# Patient Record
Sex: Female | Born: 1945 | Race: White | Hispanic: No | Smoking: Never smoker
Health system: Southern US, Community
[De-identification: ages and names within clinical notes are randomized; demographics above are authoritative.]

## PROBLEM LIST (undated history)

## (undated) DIAGNOSIS — M1990 Unspecified osteoarthritis, unspecified site: Secondary | ICD-10-CM

## (undated) DIAGNOSIS — I8311 Varicose veins of right lower extremity with inflammation: Secondary | ICD-10-CM

## (undated) DIAGNOSIS — F329 Major depressive disorder, single episode, unspecified: Secondary | ICD-10-CM

## (undated) DIAGNOSIS — K279 Peptic ulcer, site unspecified, unspecified as acute or chronic, without hemorrhage or perforation: Secondary | ICD-10-CM

## (undated) DIAGNOSIS — E669 Obesity, unspecified: Secondary | ICD-10-CM

## (undated) DIAGNOSIS — D649 Anemia, unspecified: Secondary | ICD-10-CM

## (undated) DIAGNOSIS — F32A Depression, unspecified: Secondary | ICD-10-CM

## (undated) DIAGNOSIS — C801 Malignant (primary) neoplasm, unspecified: Secondary | ICD-10-CM

## (undated) DIAGNOSIS — G4730 Sleep apnea, unspecified: Secondary | ICD-10-CM

## (undated) DIAGNOSIS — Z9109 Other allergy status, other than to drugs and biological substances: Secondary | ICD-10-CM

## (undated) DIAGNOSIS — R0600 Dyspnea, unspecified: Secondary | ICD-10-CM

## (undated) DIAGNOSIS — E785 Hyperlipidemia, unspecified: Secondary | ICD-10-CM

## (undated) DIAGNOSIS — C50919 Malignant neoplasm of unspecified site of unspecified female breast: Secondary | ICD-10-CM

## (undated) DIAGNOSIS — Z86711 Personal history of pulmonary embolism: Secondary | ICD-10-CM

## (undated) DIAGNOSIS — Z8719 Personal history of other diseases of the digestive system: Secondary | ICD-10-CM

## (undated) DIAGNOSIS — I10 Essential (primary) hypertension: Secondary | ICD-10-CM

## (undated) DIAGNOSIS — I8312 Varicose veins of left lower extremity with inflammation: Secondary | ICD-10-CM

## (undated) HISTORY — PX: VASCULAR SURGERY: SHX849

## (undated) HISTORY — PX: HERNIA REPAIR: SHX51

## (undated) HISTORY — PX: BREAST LUMPECTOMY: SHX2

## (undated) HISTORY — PX: IR SCLEROTHERAPY OF A FLUID COLLECTION: IMG6090

## (undated) HISTORY — PX: ABDOMINAL HYSTERECTOMY: SHX81

---

## 2005-01-06 ENCOUNTER — Ambulatory Visit: Payer: Self-pay | Admitting: Internal Medicine

## 2005-08-22 ENCOUNTER — Ambulatory Visit: Payer: Self-pay | Admitting: Unknown Physician Specialty

## 2005-09-19 ENCOUNTER — Ambulatory Visit: Payer: Self-pay

## 2006-01-19 ENCOUNTER — Ambulatory Visit: Payer: Self-pay | Admitting: Internal Medicine

## 2006-01-25 ENCOUNTER — Ambulatory Visit: Payer: Self-pay | Admitting: Unknown Physician Specialty

## 2006-08-21 ENCOUNTER — Ambulatory Visit: Payer: Self-pay | Admitting: Internal Medicine

## 2007-01-01 ENCOUNTER — Ambulatory Visit: Payer: Self-pay | Admitting: Internal Medicine

## 2007-01-25 ENCOUNTER — Ambulatory Visit: Payer: Self-pay | Admitting: Internal Medicine

## 2007-07-14 ENCOUNTER — Ambulatory Visit: Payer: Self-pay | Admitting: Internal Medicine

## 2007-07-27 ENCOUNTER — Ambulatory Visit: Payer: Self-pay | Admitting: Internal Medicine

## 2007-07-30 ENCOUNTER — Ambulatory Visit: Payer: Self-pay | Admitting: Internal Medicine

## 2007-08-13 ENCOUNTER — Ambulatory Visit: Payer: Self-pay | Admitting: Internal Medicine

## 2007-09-13 ENCOUNTER — Ambulatory Visit: Payer: Self-pay | Admitting: Internal Medicine

## 2007-10-14 ENCOUNTER — Ambulatory Visit: Payer: Self-pay | Admitting: Internal Medicine

## 2007-10-24 ENCOUNTER — Ambulatory Visit: Payer: Self-pay | Admitting: Unknown Physician Specialty

## 2007-10-29 ENCOUNTER — Ambulatory Visit: Payer: Self-pay | Admitting: Unknown Physician Specialty

## 2007-11-11 ENCOUNTER — Ambulatory Visit: Payer: Self-pay | Admitting: Internal Medicine

## 2007-12-14 ENCOUNTER — Ambulatory Visit: Payer: Self-pay | Admitting: Internal Medicine

## 2008-01-11 ENCOUNTER — Ambulatory Visit: Payer: Self-pay | Admitting: Internal Medicine

## 2008-02-12 ENCOUNTER — Ambulatory Visit: Payer: Self-pay | Admitting: Internal Medicine

## 2008-03-12 ENCOUNTER — Ambulatory Visit: Payer: Self-pay | Admitting: Internal Medicine

## 2008-04-12 ENCOUNTER — Ambulatory Visit: Payer: Self-pay | Admitting: Internal Medicine

## 2008-04-23 ENCOUNTER — Ambulatory Visit: Payer: Self-pay | Admitting: Internal Medicine

## 2008-04-24 ENCOUNTER — Ambulatory Visit: Payer: Self-pay | Admitting: Unknown Physician Specialty

## 2008-05-13 ENCOUNTER — Ambulatory Visit: Payer: Self-pay | Admitting: Internal Medicine

## 2008-06-10 ENCOUNTER — Ambulatory Visit: Payer: Self-pay | Admitting: Unknown Physician Specialty

## 2008-06-30 ENCOUNTER — Ambulatory Visit: Payer: Self-pay | Admitting: Unknown Physician Specialty

## 2008-07-07 ENCOUNTER — Ambulatory Visit: Payer: Self-pay | Admitting: Unknown Physician Specialty

## 2008-12-11 HISTORY — PX: JOINT REPLACEMENT: SHX530

## 2008-12-22 ENCOUNTER — Ambulatory Visit: Payer: Self-pay | Admitting: Unknown Physician Specialty

## 2009-01-01 ENCOUNTER — Inpatient Hospital Stay: Payer: Self-pay | Admitting: Unknown Physician Specialty

## 2009-01-19 ENCOUNTER — Ambulatory Visit: Payer: Self-pay

## 2009-02-17 ENCOUNTER — Ambulatory Visit: Payer: Self-pay | Admitting: Internal Medicine

## 2009-08-05 ENCOUNTER — Ambulatory Visit: Payer: Self-pay | Admitting: Internal Medicine

## 2009-08-12 ENCOUNTER — Ambulatory Visit: Payer: Self-pay | Admitting: Internal Medicine

## 2010-03-25 ENCOUNTER — Ambulatory Visit: Payer: Self-pay | Admitting: Internal Medicine

## 2011-05-10 ENCOUNTER — Ambulatory Visit: Payer: Self-pay | Admitting: Internal Medicine

## 2011-05-25 ENCOUNTER — Ambulatory Visit: Payer: Self-pay | Admitting: Internal Medicine

## 2012-05-11 ENCOUNTER — Ambulatory Visit: Payer: Self-pay | Admitting: Internal Medicine

## 2012-05-25 ENCOUNTER — Ambulatory Visit: Payer: Self-pay | Admitting: Internal Medicine

## 2012-09-12 DIAGNOSIS — Z923 Personal history of irradiation: Secondary | ICD-10-CM

## 2012-09-12 DIAGNOSIS — C801 Malignant (primary) neoplasm, unspecified: Secondary | ICD-10-CM

## 2012-09-12 DIAGNOSIS — C50919 Malignant neoplasm of unspecified site of unspecified female breast: Secondary | ICD-10-CM

## 2012-09-12 HISTORY — DX: Malignant neoplasm of unspecified site of unspecified female breast: C50.919

## 2012-09-12 HISTORY — DX: Personal history of irradiation: Z92.3

## 2012-09-12 HISTORY — DX: Malignant (primary) neoplasm, unspecified: C80.1

## 2013-05-27 ENCOUNTER — Ambulatory Visit: Payer: Self-pay | Admitting: Internal Medicine

## 2013-05-30 ENCOUNTER — Ambulatory Visit: Payer: Self-pay | Admitting: Unknown Physician Specialty

## 2013-06-04 ENCOUNTER — Ambulatory Visit: Payer: Self-pay | Admitting: Internal Medicine

## 2013-06-06 ENCOUNTER — Ambulatory Visit: Payer: Self-pay | Admitting: Internal Medicine

## 2013-06-06 HISTORY — PX: BREAST BIOPSY: SHX20

## 2013-06-12 LAB — PATHOLOGY REPORT

## 2013-06-13 ENCOUNTER — Encounter: Payer: Self-pay | Admitting: *Deleted

## 2013-06-20 ENCOUNTER — Ambulatory Visit: Payer: Self-pay | Admitting: Surgery

## 2013-06-25 ENCOUNTER — Ambulatory Visit: Payer: Self-pay | Admitting: Surgery

## 2013-07-01 ENCOUNTER — Ambulatory Visit: Payer: Self-pay | Admitting: General Surgery

## 2013-07-02 ENCOUNTER — Ambulatory Visit: Payer: Self-pay | Admitting: Oncology

## 2013-07-02 LAB — PATHOLOGY REPORT

## 2013-07-13 ENCOUNTER — Ambulatory Visit: Payer: Self-pay | Admitting: Oncology

## 2013-08-01 LAB — CBC CANCER CENTER
Basophil #: 0.1 x10 3/mm (ref 0.0–0.1)
Eosinophil #: 0.3 x10 3/mm (ref 0.0–0.7)
Eosinophil %: 4.9 %
HCT: 43.3 % (ref 35.0–47.0)
HGB: 14 g/dL (ref 12.0–16.0)
Lymphocyte #: 1.1 x10 3/mm (ref 1.0–3.6)
Lymphocyte %: 16.4 %
MCH: 29.8 pg (ref 26.0–34.0)
MCHC: 32.5 g/dL (ref 32.0–36.0)
MCV: 92 fL (ref 80–100)
Monocyte #: 0.7 x10 3/mm (ref 0.2–0.9)
Monocyte %: 9.5 %
Neutrophil %: 68.4 %
Platelet: 393 x10 3/mm (ref 150–440)
RBC: 4.71 10*6/uL (ref 3.80–5.20)
WBC: 7 x10 3/mm (ref 3.6–11.0)

## 2013-08-12 ENCOUNTER — Ambulatory Visit: Payer: Self-pay | Admitting: Oncology

## 2013-08-15 LAB — CBC CANCER CENTER
Basophil %: 1.1 %
Eosinophil %: 7.9 %
HCT: 39.1 % (ref 35.0–47.0)
HGB: 13.1 g/dL (ref 12.0–16.0)
Lymphocyte #: 0.7 x10 3/mm — ABNORMAL LOW (ref 1.0–3.6)
MCH: 30.6 pg (ref 26.0–34.0)
MCHC: 33.6 g/dL (ref 32.0–36.0)
Monocyte #: 0.6 x10 3/mm (ref 0.2–0.9)
Monocyte %: 12.8 %
Neutrophil #: 2.9 x10 3/mm (ref 1.4–6.5)
Platelet: 310 x10 3/mm (ref 150–440)
RBC: 4.29 10*6/uL (ref 3.80–5.20)
RDW: 13.5 % (ref 11.5–14.5)
WBC: 4.6 x10 3/mm (ref 3.6–11.0)

## 2013-08-22 LAB — CBC CANCER CENTER
Basophil #: 0 x10 3/mm (ref 0.0–0.1)
Basophil %: 0.4 %
HCT: 42.7 % (ref 35.0–47.0)
HGB: 14.1 g/dL (ref 12.0–16.0)
MCH: 30.3 pg (ref 26.0–34.0)
Monocyte %: 5.2 %
Neutrophil %: 87.1 %
Platelet: 404 x10 3/mm (ref 150–440)
RBC: 4.65 10*6/uL (ref 3.80–5.20)
WBC: 12 x10 3/mm — ABNORMAL HIGH (ref 3.6–11.0)

## 2013-08-29 ENCOUNTER — Ambulatory Visit: Payer: Self-pay | Admitting: Cardiology

## 2013-08-29 LAB — CBC CANCER CENTER
Basophil %: 1.1 %
Eosinophil #: 0.3 x10 3/mm (ref 0.0–0.7)
Eosinophil %: 3.5 %
HCT: 41.1 % (ref 35.0–47.0)
HGB: 13.4 g/dL (ref 12.0–16.0)
Lymphocyte %: 11.1 %
MCH: 29.9 pg (ref 26.0–34.0)
Monocyte #: 0.7 x10 3/mm (ref 0.2–0.9)
Neutrophil %: 74.7 %
RBC: 4.49 10*6/uL (ref 3.80–5.20)
RDW: 13.8 % (ref 11.5–14.5)

## 2013-09-12 ENCOUNTER — Ambulatory Visit: Payer: Self-pay | Admitting: Oncology

## 2013-09-25 ENCOUNTER — Ambulatory Visit: Payer: Self-pay | Admitting: Cardiology

## 2013-10-13 ENCOUNTER — Ambulatory Visit: Payer: Self-pay | Admitting: Oncology

## 2013-11-15 DIAGNOSIS — M23329 Other meniscus derangements, posterior horn of medial meniscus, unspecified knee: Secondary | ICD-10-CM | POA: Insufficient documentation

## 2013-11-15 DIAGNOSIS — I10 Essential (primary) hypertension: Secondary | ICD-10-CM | POA: Insufficient documentation

## 2013-11-15 DIAGNOSIS — Z9660 Presence of unspecified orthopedic joint implant: Secondary | ICD-10-CM | POA: Insufficient documentation

## 2014-01-06 ENCOUNTER — Ambulatory Visit: Payer: Self-pay | Admitting: Oncology

## 2014-01-06 LAB — CBC CANCER CENTER
BASOS PCT: 1.1 %
Basophil #: 0.1 x10 3/mm (ref 0.0–0.1)
EOS ABS: 0.3 x10 3/mm (ref 0.0–0.7)
EOS PCT: 3.9 %
HCT: 39.5 % (ref 35.0–47.0)
HGB: 13.2 g/dL (ref 12.0–16.0)
Lymphocyte #: 0.9 x10 3/mm — ABNORMAL LOW (ref 1.0–3.6)
Lymphocyte %: 13.4 %
MCH: 30.3 pg (ref 26.0–34.0)
MCHC: 33.5 g/dL (ref 32.0–36.0)
MCV: 90 fL (ref 80–100)
Monocyte #: 0.6 x10 3/mm (ref 0.2–0.9)
Monocyte %: 9.1 %
Neutrophil #: 4.9 x10 3/mm (ref 1.4–6.5)
Neutrophil %: 72.5 %
Platelet: 351 x10 3/mm (ref 150–440)
RBC: 4.37 10*6/uL (ref 3.80–5.20)
RDW: 13.3 % (ref 11.5–14.5)
WBC: 6.7 x10 3/mm (ref 3.6–11.0)

## 2014-01-06 LAB — COMPREHENSIVE METABOLIC PANEL
Albumin: 3.5 g/dL (ref 3.4–5.0)
Alkaline Phosphatase: 113 U/L
Anion Gap: 10 (ref 7–16)
BUN: 14 mg/dL (ref 7–18)
Bilirubin,Total: 0.2 mg/dL (ref 0.2–1.0)
Calcium, Total: 9.4 mg/dL (ref 8.5–10.1)
Chloride: 106 mmol/L (ref 98–107)
Co2: 27 mmol/L (ref 21–32)
Creatinine: 1.09 mg/dL (ref 0.60–1.30)
EGFR (African American): >60
EGFR (Non-African Amer.): 53 — ABNORMAL LOW
Glucose: 108 mg/dL — ABNORMAL HIGH (ref 65–99)
Osmolality: 286 (ref 275–301)
POTASSIUM: 4 mmol/L (ref 3.5–5.1)
SGOT(AST): 21 U/L (ref 15–37)
SGPT (ALT): 34 U/L (ref 12–78)
Sodium: 143 mmol/L (ref 136–145)
TOTAL PROTEIN: 7.1 g/dL (ref 6.4–8.2)

## 2014-01-10 ENCOUNTER — Ambulatory Visit: Payer: Self-pay | Admitting: Oncology

## 2014-02-07 DIAGNOSIS — C50412 Malignant neoplasm of upper-outer quadrant of left female breast: Secondary | ICD-10-CM | POA: Insufficient documentation

## 2014-02-07 DIAGNOSIS — R739 Hyperglycemia, unspecified: Secondary | ICD-10-CM | POA: Insufficient documentation

## 2014-02-07 DIAGNOSIS — G4733 Obstructive sleep apnea (adult) (pediatric): Secondary | ICD-10-CM | POA: Insufficient documentation

## 2014-04-22 ENCOUNTER — Ambulatory Visit: Payer: Self-pay | Admitting: Oncology

## 2014-04-22 LAB — COMPREHENSIVE METABOLIC PANEL
Albumin: 3.6 g/dL (ref 3.4–5.0)
Alkaline Phosphatase: 121 U/L — ABNORMAL HIGH
Anion Gap: 11 (ref 7–16)
BUN: 22 mg/dL — AB (ref 7–18)
Bilirubin,Total: 0.3 mg/dL (ref 0.2–1.0)
CALCIUM: 9.3 mg/dL (ref 8.5–10.1)
CHLORIDE: 102 mmol/L (ref 98–107)
CO2: 24 mmol/L (ref 21–32)
CREATININE: 1.25 mg/dL (ref 0.60–1.30)
EGFR (African American): 52 — ABNORMAL LOW
EGFR (Non-African Amer.): 44 — ABNORMAL LOW
Glucose: 105 mg/dL — ABNORMAL HIGH (ref 65–99)
Osmolality: 278 (ref 275–301)
Potassium: 4.6 mmol/L (ref 3.5–5.1)
SGOT(AST): 23 U/L (ref 15–37)
SGPT (ALT): 36 U/L
SODIUM: 137 mmol/L (ref 136–145)
Total Protein: 7.4 g/dL (ref 6.4–8.2)

## 2014-04-22 LAB — CBC CANCER CENTER
BASOS ABS: 0.1 x10 3/mm (ref 0.0–0.1)
Basophil %: 1 %
Eosinophil #: 0.3 x10 3/mm (ref 0.0–0.7)
Eosinophil %: 4.1 %
HCT: 41.7 % (ref 35.0–47.0)
HGB: 13.8 g/dL (ref 12.0–16.0)
LYMPHS ABS: 0.9 x10 3/mm — AB (ref 1.0–3.6)
LYMPHS PCT: 13.2 %
MCH: 30 pg (ref 26.0–34.0)
MCHC: 32.9 g/dL (ref 32.0–36.0)
MCV: 91 fL (ref 80–100)
MONOS PCT: 8.5 %
Monocyte #: 0.6 x10 3/mm (ref 0.2–0.9)
NEUTROS ABS: 4.9 x10 3/mm (ref 1.4–6.5)
NEUTROS PCT: 73.2 %
Platelet: 342 x10 3/mm (ref 150–440)
RBC: 4.58 10*6/uL (ref 3.80–5.20)
RDW: 13.4 % (ref 11.5–14.5)
WBC: 6.6 x10 3/mm (ref 3.6–11.0)

## 2014-05-13 ENCOUNTER — Ambulatory Visit: Payer: Self-pay | Admitting: Oncology

## 2014-06-12 ENCOUNTER — Ambulatory Visit: Payer: Self-pay | Admitting: Oncology

## 2014-10-28 ENCOUNTER — Ambulatory Visit: Payer: Self-pay | Admitting: Oncology

## 2014-11-11 ENCOUNTER — Ambulatory Visit
Admit: 2014-11-11 | Disposition: A | Discharge status: Home / Self Care | Payer: Self-pay | Attending: Oncology | Admitting: Oncology

## 2014-12-30 ENCOUNTER — Ambulatory Visit
Admit: 2014-12-30 | Disposition: A | Discharge status: Home / Self Care | Payer: Self-pay | Attending: Ophthalmology | Admitting: Ophthalmology

## 2015-01-02 NOTE — Op Note (Signed)
PATIENT NAME:  Beth Hoover, SPIEWAK MR#:  [id number] DATE OF BIRTH:  02-04-1946  DATE OF PROCEDURE:  06/25/2013  PREOPERATIVE DIAGNOSIS: Carcinoma of the left breast.   POSTOPERATIVE DIAGNOSIS: Carcinoma of the left breast.   PROCEDURE: Left partial mastectomy with axillary sentinel lymph node biopsy.   SURGEON: Rochel Brome, M.D.   ANESTHESIA: General.   INDICATIONS: This 69 year old female recently had a mammogram depicting a density in the upper outer quadrant of the left breast at approximately 12:30 position, 5 cm from the nipple. She had ultrasound-guided core biopsy demonstrating infiltrating ductal carcinoma. She also had a palpable nodule in the inferior aspect of the left axilla, which was consistent with a lymph node. Surgery was recommended for definitive treatment. She did have preoperative injection of radioactive technetium sulfur colloid. She had x-ray needle localization. The mammogram images were reviewed seeing the Kopans wire and the biopsy marker in the upper outer aspect of the left breast.   DESCRIPTION OF PROCEDURE: The patient was placed on the operating table in the supine position under general anesthesia. The dressing was removed from the left breast exposing the Kopans wire, which entered the breast at approximately the 2:00 position. Mammogram images were reviewed. The left arm was placed on a lateral arm support. The left breast, axilla, and surrounding chest wall and upper arm were prepared with ChloraPrep and draped in a sterile manner.   The axilla was probed with a gamma counter demonstrating location of radioactivity in the inferior aspect of the axilla. There was no grossly palpable mass at this time. An oblique incision was made in the inferior aspect of the axilla, approximately 5 cm in length and carried down through subcutaneous tissues. Several small bleeding points were cauterized. The superficial fascia was incised. Dissection was carried down deeply within the  axilla adjacent to the rib cage. There was a finding of radioactivity using the gamma counter and dissected down to encounter a lymph node, which was approximately 12 mm in dimension and was consistent with what was palpable during the preop exam. This was rounded and smooth and slightly firm. It was dissected free from surrounding structures. The ex vivo count was greater than 3500 counts per second. The background count was less than 40 counts per second. There was no remaining palpable mass within the axilla. The sentinel lymph node was sent for routine pathology. The wound was further inspected. It is noted that during the course of the procedure, a number of small bleeding points were cauterized. Hemostasis subsequently appeared to be intact.   Attention was then turned to do the left partial mastectomy. A curvilinear incision was made from approximately the 1:00 to 3:00 position and this was elliptical and curvilinear and excised a width of skin of approximately 8 mm surrounding the wire. It was dissected down on both sides of the wire and enlarged as the dissection as it progressed deeper. There was some firmness around the biopsy site and a finger palpation helped in the scope was dissection and a portion of tissue, which was approximately 5 x 5 x 5 cm was resected. It was labeled with the margin map to mark the cranial, caudal, medial, lateral, and deep margins. Also, the 3:00 end of the skin ellipse was tagged with a nylon stitch for the pathologist's orientation and the specimen was submitted fresh for specimen mammogram and pathology with gross inspection for margins.   The axillary wound was further inspected and hemostasis appeared to be intact. Subcutaneous tissues were  infiltrated with 0.5% Sensorcaine with epinephrine. The wound was closed with running 4-0 Monocryl subcuticular suture. Next, the partial mastectomy incision was further examined and hemostasis appeared to be intact. Sensorcaine  0.5% with epinephrine was injected around cautery artifact and also in the subcutaneous tissues. Next, the subcutaneous tissues were closed with interrupted 4-0 chromic simple sutures and the skin was closed with running 4-0 Monocryl subcuticular suture. The pathologist did call back indicating the closest margin was inferior and approximately 8 mm and tissue submitted for routine pathology.   The wounds were treated with Dermabond. The patient was then awakened and carried to the recovery room for postoperative care.   ____________________________ J. Rochel Brome, MD jws:aw D: 06/25/2013 12:42:37 ET T: 06/25/2013 13:04:04 ET JOB#: [id number]  cc: Loreli Dollar, MD, <Dictator> Loreli Dollar MD ELECTRONICALLY SIGNED 06/25/2013 13:55

## 2015-01-02 NOTE — Consult Note (Signed)
Reason for Visit: This 69 year old Female patient presents to the clinic for initial evaluation of  breast cancer .   Referred by Dr. Tamala Julian.  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 Total dimensions 4 mm DCIS present margins negative reexcision no residual tumor for adjuvant whole breast radiation plus aromatase inhibitor. Patient is stage I (T1 A. N0 M0)  Pathology Report pathology reports reviewed   Imaging Report mammograms and ultrasound reviewed   Referral Report clinical notes reviewed   Planned Treatment Regimen adjuvant whole breast radiation therapy plus her aromatase inhibitor   HPI   patient is a 69 year old femalewho presented with a 4 mm irregular mass at the 12:30 position of the left breast 5 cm from the nipple. This was confirmed on ultrasound and recommendation for biopsy was made. She underwent core biopsy which was positive for invasive mammary carcinoma grade 1 DCIS was present. One sentinel lymph node was performed negative for metastatic disease. Tumor was strongly ER/PR positive HER-2/neu not overexpressed no lymphovascular invasion was seen. On reexcision no residual tumor was identified.she has done well postoperatively has been seen by medical oncology and based on the small size of her lesion is not thought to be a candidate for systemic chemotherapy. She is seen today for consideration of whole breast radiation. She is doing well. She specifically denies breast tenderness cough or bone pain.  Past Hx:    Breast Cancer:    exertional dyspnea:    Osteoarthritis:    GERD - Esophageal Reflux:    Hiatal Hernia:    Peptic Ulcer Disease:    pharyngitis-recurrent:    Depression:    Obesity:    Hyperlipidemia:    iron def. anemia:    paraesophageal hernia:    right leg vein  stripping and sclerotherapy of legs:    Hysterectomy/oophorectomy:    hiatal hernia surgery: Mar 2009  Past, Family and Social History:  Past Medical History positive   Cardiovascular hyperlipidemia   Respiratory exertional dyspnea, recurrent pharyngitis   Gastrointestinal GERD; hiatal hernia, peptic ulcer disease   Neurological/Psychiatric depression   Past Surgical History hysterectomy nephrectomy, right leg vein stripping, paraesophageal hernia   Past Medical History Comments osteoarthritis, obesity, iron deficiency anemia   Family History positive   Family History Comments breast cancer a paternal and maternal aunts, also family history of colon cancer   Social History noncontributory   Additional Past Medical and Surgical History seen by herself today   Allergies:   Septra: Rash  Actifed: Unknown  Home Meds:  Home Medications: Medication Instructions Status  Metoprolol Tartrate 50 mg oral tablet 1 tab(s) orally once a day (in the morning) Active  meloxicam 15 mg oral tablet 1 tab(s) orally once a day Active  Lipitor 20 mg oral tablet 1 tab(s) orally once a day (at bedtime)  Active  zolpidem 10 mg oral tablet 0.5 tab(s) orally once a day (at bedtime) Active  venlafaxine extended release 150 mg oral tablet, extended release 1 tab(s) orally once a day Active   Review of Systems:  General negative   Performance Status (ECOG) 0   Skin negative   Breast see HPI   Ophthalmologic negative   ENMT negative   Respiratory and Thorax negative   Cardiovascular negative   Gastrointestinal negative   Genitourinary negative   Musculoskeletal negative   Neurological negative  Psychiatric negative   Hematology/Lymphatics negative   Endocrine negative   Allergic/Immunologic negative   Review of Systems   review of systems obtained from nurse's notes  Nursing Notes:  Nursing Vital Signs and Chemo Nursing Nursing Notes: *CC Vital Signs Flowsheet:    27-Oct-14 09:17  Temp Temperature 97.5  Pulse Pulse 79  Respirations Respirations 20  SBP SBP 145  DBP DBP 85  Pain Scale (0-10)  0  Current Weight (kg) (kg) 108.7  Height (cm) centimeters 165  BSA (m2) 2.1   Physical Exam:  General/Skin/HEENT:  General normal   Skin normal   Eyes normal   ENMT normal   Head and Neck normal   Additional PE well-developed obese female in NAD. Breasts are large. She status post wide local excision and sentinel node biopsy the left breast. Breast is healing well no dominant mass or nodularity is noted in either breast into position examined. Lungs are clear to A&P cardiac examination shows regular rate and rhythm. Abdomen is benign. No axillary or supraclavicular adenopathy is appreciated.   Breasts/Resp/CV/GI/GU:  Respiratory and Thorax normal   Cardiovascular normal   Gastrointestinal normal   Genitourinary normal   MS/Neuro/Psych/Lymph:  Musculoskeletal normal   Neurological normal   Lymphatics normal   Other Results:  Radiology Results: LabUnknown:    15-Sep-14 16:38, Screening Digital Mammogram  PACS Image     23-Sep-14 08:53, Digital Additional Views Lt Breast [facility])  PACS Image   [facility]:  Digital Additional Views Lt Breast (SCR)   REASON FOR EXAM:    av lt mass  COMMENTS:       PROCEDURE: MAM - MAM DGTL ADD VW LT  SCR  - Jun 04 2013  8:53AM     *RADIOLOGY REPORT*    Clinical Data:  Possible mass left breast identified on one-view of  the recent screening mammogram.    DIGITAL DIAGNOSTIC LEFT MAMMOGRAM WITH CAD AND LEFT BREAST  ULTRASOUND:    Comparison:  05/27/2013  Findings:    ACR Breast Density Category b:  There are scattered areas of  fibroglandular density.    Focal spot compression view of the upper left breast, anterior  third, demonstrates a slightly irregular approximately 4 mm nodule.  This nodule is not definitely seen on the  90 degrees lateral  projection performed today.  The nodule  is not visible on the CC  view of the left breast from recent screening mammogram.    Mammographic images were processed with CAD.    On physical exam, no mass is palpated in the upper left breast.  Ultrasound is performed, showing an irregular hypoechoic mass at  12:30 position 5 cm from the nipple measuring 3 x 4 x 4 mm.  No  internal vascular flow is identified.    Ultrasound of the left axilla demonstrates normal left axillary  lymph nodes.  No lymphadenopathy is detected.     IMPRESSION:  4 mm irregular mass 12:30 position left breast.  Malignancy cannot  be excluded.    RECOMMENDATION:  Ultrasound guided core needle biopsy is recommended.  The procedure  of biopsy has been discussed with the patient today. Ultrasound-  guided biopsy has been scheduled for the patient at 1:00 pm on  06/06/2013.    I have discussed the findings and recommendations with the patient.  Results were also provided in writing at the conclusion of the  visit.  If applicable, a reminder letter will be sent to the  patient regarding the  next appointment.    BI-RADS CATEGORY 4:  Suspicious abnormality - biopsy should be  considered.      Original Report Authenticated By: Curlene Dolphin, M.D.         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 Based on her large breasts cannot use the hyperfractionated regimens. Would also boost or scar another 1600 cGy using electron beam. Risks and benefits of treatment including skin reaction, fatigue, alteration of blood counts, and possible occlusion of some superficial lung all were explained in detail to the patient. I have set her up for CT situation early next week. Patient will also be a candidate  for aromatase inhibitor after completion of radiation.  I would like to take this opportunity to thank you for allowing me to continue to participate in this patient's care.  CC Referral:  cc: Dr. Tamala Julian, Dr. Fulton Reek   Electronic Signatures: Baruch Gouty, Roda Shutters (MD)  (Signed 27-Oct-14 11:32)  Authored: HPI, Diagnosis, Past Hx, PFSH, Allergies, Home Meds, ROS, Nursing Notes, Physical Exam, Other Results, Encounter Assessment and Plan, CC Referring Physician   Last Updated: 27-Oct-14 11:32 by Armstead Peaks (MD)

## 2015-02-16 ENCOUNTER — Other Ambulatory Visit: Payer: Self-pay | Admitting: Internal Medicine

## 2015-02-16 DIAGNOSIS — R61 Generalized hyperhidrosis: Secondary | ICD-10-CM

## 2015-02-16 DIAGNOSIS — R0789 Other chest pain: Secondary | ICD-10-CM

## 2015-02-16 DIAGNOSIS — R9389 Abnormal findings on diagnostic imaging of other specified body structures: Secondary | ICD-10-CM

## 2015-02-17 ENCOUNTER — Ambulatory Visit
Admission: RE | Admit: 2015-02-17 | Discharge: 2015-02-17 | Disposition: A | Discharge status: Home / Self Care | Payer: Medicare Other | Source: Ambulatory Visit | Attending: Internal Medicine | Admitting: Internal Medicine

## 2015-02-17 DIAGNOSIS — R9389 Abnormal findings on diagnostic imaging of other specified body structures: Secondary | ICD-10-CM

## 2015-02-17 DIAGNOSIS — R61 Generalized hyperhidrosis: Secondary | ICD-10-CM | POA: Insufficient documentation

## 2015-02-17 DIAGNOSIS — R0789 Other chest pain: Secondary | ICD-10-CM | POA: Insufficient documentation

## 2015-02-17 DIAGNOSIS — R938 Abnormal findings on diagnostic imaging of other specified body structures: Secondary | ICD-10-CM | POA: Diagnosis not present

## 2015-02-17 HISTORY — DX: Essential (primary) hypertension: I10

## 2015-02-17 HISTORY — DX: Malignant (primary) neoplasm, unspecified: C80.1

## 2015-03-26 ENCOUNTER — Other Ambulatory Visit: Payer: Self-pay | Admitting: Family Medicine

## 2015-04-24 ENCOUNTER — Other Ambulatory Visit: Payer: Self-pay | Admitting: *Deleted

## 2015-04-24 DIAGNOSIS — C50919 Malignant neoplasm of unspecified site of unspecified female breast: Secondary | ICD-10-CM

## 2015-04-28 ENCOUNTER — Inpatient Hospital Stay: Payer: Medicare Other | Admitting: Oncology

## 2015-04-28 ENCOUNTER — Inpatient Hospital Stay: Payer: Medicare Other | Attending: Oncology

## 2015-04-28 DIAGNOSIS — Z170 Estrogen receptor positive status [ER+]: Secondary | ICD-10-CM | POA: Insufficient documentation

## 2015-04-28 DIAGNOSIS — C50912 Malignant neoplasm of unspecified site of left female breast: Secondary | ICD-10-CM | POA: Insufficient documentation

## 2015-04-28 DIAGNOSIS — Z79811 Long term (current) use of aromatase inhibitors: Secondary | ICD-10-CM | POA: Insufficient documentation

## 2015-04-28 DIAGNOSIS — Z923 Personal history of irradiation: Secondary | ICD-10-CM | POA: Insufficient documentation

## 2015-05-12 ENCOUNTER — Encounter: Payer: Self-pay | Admitting: Oncology

## 2015-05-12 ENCOUNTER — Inpatient Hospital Stay (HOSPITAL_BASED_OUTPATIENT_CLINIC_OR_DEPARTMENT_OTHER): Payer: Medicare Other | Admitting: Oncology

## 2015-05-12 ENCOUNTER — Inpatient Hospital Stay: Payer: Medicare Other

## 2015-05-12 VITALS — BP 132/90 | HR 88 | Temp 96.8°F | Wt 241.0 lb

## 2015-05-12 DIAGNOSIS — C50912 Malignant neoplasm of unspecified site of left female breast: Secondary | ICD-10-CM

## 2015-05-12 DIAGNOSIS — M8580 Other specified disorders of bone density and structure, unspecified site: Secondary | ICD-10-CM

## 2015-05-12 DIAGNOSIS — Z170 Estrogen receptor positive status [ER+]: Secondary | ICD-10-CM

## 2015-05-12 DIAGNOSIS — Z79899 Other long term (current) drug therapy: Secondary | ICD-10-CM

## 2015-05-12 DIAGNOSIS — M1990 Unspecified osteoarthritis, unspecified site: Secondary | ICD-10-CM

## 2015-05-12 DIAGNOSIS — K219 Gastro-esophageal reflux disease without esophagitis: Secondary | ICD-10-CM

## 2015-05-12 DIAGNOSIS — Z923 Personal history of irradiation: Secondary | ICD-10-CM | POA: Diagnosis not present

## 2015-05-12 DIAGNOSIS — Z79811 Long term (current) use of aromatase inhibitors: Secondary | ICD-10-CM

## 2015-05-12 DIAGNOSIS — C50919 Malignant neoplasm of unspecified site of unspecified female breast: Secondary | ICD-10-CM

## 2015-05-12 DIAGNOSIS — E785 Hyperlipidemia, unspecified: Secondary | ICD-10-CM

## 2015-05-12 LAB — CBC WITH DIFFERENTIAL/PLATELET
BASOS PCT: 1 %
Basophils Absolute: 0.1 10*3/uL (ref 0–0.1)
EOS ABS: 0.2 10*3/uL (ref 0–0.7)
EOS PCT: 3 %
HCT: 42 % (ref 35.0–47.0)
Hemoglobin: 14.2 g/dL (ref 12.0–16.0)
Lymphocytes Relative: 30 %
Lymphs Abs: 2 10*3/uL (ref 1.0–3.6)
MCH: 30 pg (ref 26.0–34.0)
MCHC: 33.8 g/dL (ref 32.0–36.0)
MCV: 88.8 fL (ref 80.0–100.0)
MONO ABS: 0.6 10*3/uL (ref 0.2–0.9)
MONOS PCT: 9 %
Neutro Abs: 3.7 10*3/uL (ref 1.4–6.5)
Neutrophils Relative %: 57 %
Platelets: 396 10*3/uL (ref 150–440)
RBC: 4.73 MIL/uL (ref 3.80–5.20)
RDW: 14 % (ref 11.5–14.5)
WBC: 6.5 10*3/uL (ref 3.6–11.0)

## 2015-05-12 LAB — BASIC METABOLIC PANEL
Anion gap: 5 (ref 5–15)
BUN: 21 mg/dL — AB (ref 6–20)
CALCIUM: 8.9 mg/dL (ref 8.9–10.3)
CO2: 25 mmol/L (ref 22–32)
CREATININE: 1.14 mg/dL — AB (ref 0.44–1.00)
Chloride: 102 mmol/L (ref 101–111)
GFR calc Af Amer: 56 mL/min — ABNORMAL LOW (ref >60)
GFR calc non Af Amer: 48 mL/min — ABNORMAL LOW (ref >60)
Glucose, Bld: 129 mg/dL — ABNORMAL HIGH (ref 65–99)
Potassium: 4.5 mmol/L (ref 3.5–5.1)
SODIUM: 132 mmol/L — AB (ref 135–145)

## 2015-05-12 NOTE — Progress Notes (Signed)
Patient does have living will.  Never smoked.  Patient had a "knot" that came up on her left clavicle.  Saw her PCP who did a CT - turned out to be arthritis.  No other c/o today.

## 2015-05-12 NOTE — Progress Notes (Signed)
Beth Hoover @ [facility] Telephone:([phone]) [phone]  Fax:([phone] [place]: 14-Jan-1946  MR#: [id number]  NKN#:[id number]  Patient Care Team: Idelle Crouch, MD as PCP - General (Internal Medicine)  CHIEF COMPLAINT:  Chief Complaint  Patient presents with  . Follow-up     Chief Complaint/Diagnosis:   1. Invasive Carcinoma of the left breast.  Status post partial left breast mastectomy (June 25, 2013). T1a (4 mm tumor) N0 M0, Stage Ia, Estrogen receptor positive, Progesterone receptor +2, HER-2/neu receptor negative by FISH. 2. Finished radiation therapy. 3. Discontinued letrozole in April 2015 due to side effect. 4. Started   ARIMIDEX April 2015. HPI:     INTERVAL HISTORY: 69 year old lady came today for further follow-up regarding carcinoma of breast.  Stage I disease.  Patient is on  anastrozole .  NO BONY PAINS.   HERE  FOR FURTHER FOLLOW-UP AND TREATMENT CONSIDERATION  REVIEW OF SYSTEMS:   GENERAL:  Feels good.  Active.  No fevers, sweats or weight loss. PERFORMANCE STATUS (ECOG):  0 HEENT:  No visual changes, runny nose, sore throat, mouth sores or tenderness. Lungs: No shortness of breath or cough.  No hemoptysis. Cardiac:  No chest pain, palpitations, orthopnea, or PND. GI:  No nausea, vomiting, diarrhea, constipation, melena or hematochezia. GU:  No urgency, frequency, dysuria, or hematuria. Musculoskeletal:  No back pain.  No joint pain.  No muscle tenderness. Extremities:  No pain or swelling. Skin:  No rashes or skin changes. Neuro:  No headache, numbness or weakness, balance or coordination issues. Endocrine:  No diabetes, thyroid issues, hot flashes or night sweats. Psych:  No mood changes, depression or anxiety. Pain:  No focal pain. Review of systems:  All other systems reviewed and found to be negative. As per HPI. Otherwise, a complete review of systems is negatve.  PAST MEDICAL HISTORY: Past Medical History  Diagnosis Date  . Cancer  2014    Left Breast CA with Lumpectomy and 36 Rad tx's.  Marland Kitchen Hypertension    Patient does have advance healthcare directive, Patient   does not desire to make any changes Review of Systems:  General: denies complaints  Performance Status (ECOG): 0  Psych: anxiety  Review of Systems: All other systems were reviewed and found to be negative  Review of Systems:   As per HPI. Otherwise, 10 point system review was negative.   Allergies:  Septra: Rash  Actifed: Unknown  Significant History/PMH:   Breast Cancer:    exertional dyspnea:    Osteoarthritis:    GERD - Esophageal Reflux:    Hiatal Hernia:    Peptic Ulcer Disease:    pharyngitis-recurrent:    Depression:    Obesity:    Hyperlipidemia:    iron def. anemia:    paraesophageal hernia:    right leg vein stripping and sclerotherapy of legs:    Hysterectomy/oophorectomy:    hiatal hernia surgery: Mar 2009  Preventive Screening:  Has patient had any of the following test? Colonscopy  Mammography  Pap Smear   Last Colonoscopy: sept 2014   Last Mammography: 05/2014   Last Pap Smear: don't remember had hysterectomy   Smoking History: Smoking History Never Smoked.  PFSH: Comments: Positive for breast cancer in paternal  and maternal aunt.  Family history of colon cancer  Social History: negative alcohol, negative tobacco  Additional Past Medical and Surgical History: As mentioned above   HEALTH MAINTENANCE: Social History  Substance Use Topics  . Smoking status:  Never Smoker   . Smokeless tobacco: Not on file  . Alcohol Use: Not on file      Allergies  Allergen Reactions  . Actifed Cold-Allergy [Chlorpheniramine-Phenylephrine] Palpitations  . Septra [Sulfamethoxazole-Trimethoprim] Rash    Current Outpatient Prescriptions  Medication Sig Dispense Refill  . acetaminophen (TYLENOL) 500 MG tablet Take 500 mg by mouth every 6 (six) hours as needed.    Marland Kitchen anastrozole (ARIMIDEX) 1 MG tablet Take 1  tablet by mouth once a day 30 tablet 5  . anastrozole (ARIMIDEX) 1 MG tablet Take by mouth.    Marland Kitchen aspirin EC 81 MG tablet Take by mouth.    Marland Kitchen atorvastatin (LIPITOR) 40 MG tablet Take by mouth.    . hydrochlorothiazide (HYDRODIURIL) 25 MG tablet Take by mouth.    . meloxicam (MOBIC) 15 MG tablet Take by mouth.    . metoprolol (LOPRESSOR) 50 MG tablet Take by mouth.    . Multiple Vitamin (MULTI-VITAMINS) TABS Take by mouth.    . Omega-3 Fatty Acids (FISH OIL) 1000 MG CAPS Take by mouth.    . pantoprazole (PROTONIX) 40 MG tablet Take by mouth.    . venlafaxine XR (EFFEXOR-XR) 150 MG 24 hr capsule Take by mouth.    . zolpidem (AMBIEN) 10 MG tablet Take by mouth.     No current facility-administered medications for this visit.    OBJECTIVE:  Filed Vitals:   05/12/15 1203  BP: 132/90  Pulse: 88  Temp: 96.8 F (36 C)     There is no height on file to calculate BMI.    ECOG FS:0 - Asymptomatic  PHYSICAL EXAM: GENERAL:  Well developed, well nourished, sitting comfortably in the exam room in no acute distress. MENTAL STATUS:  Alert and oriented to person, place and time.  ENT:  Oropharynx clear without lesion.  Tongue normal. Mucous membranes moist.  RESPIRATORY:  Clear to auscultation without rales, wheezes or rhonchi. CARDIOVASCULAR:  Regular rate and rhythm without murmur, rub or gallop. BREAST:  Right breast without masses, skin changes or nipple discharge.  Left breast without masses, skin changes or nipple discharge. ABDOMEN:  Soft, non-tender, with active bowel sounds, and no hepatosplenomegaly.  No masses. BACK:  No CVA tenderness.  No tenderness on percussion of the back or rib cage. SKIN:  No rashes, ulcers or lesions. EXTREMITIES: No edema, no skin discoloration or tenderness.  No palpable cords. LYMPH NODES: No palpable cervical, supraclavicular, axillary or inguinal adenopathy  NEUROLOGICAL: Unremarkable. PSYCH:  Appropriate. Recent chest nodule which has been evaluated  with CT scan appears to be arthritis   LAB RESULTS:  CBC Latest Ref Rng 05/12/2015 04/22/2014  WBC 3.6 - 11.0 K/uL 6.5 6.6  Hemoglobin 12.0 - 16.0 g/dL 14.2 13.8  Hematocrit 35.0 - 47.0 % 42.0 41.7  Platelets 150 - 440 K/uL 396 342    Appointment on 05/12/2015  Component Date Value Ref Range Status  . WBC 05/12/2015 6.5  3.6 - 11.0 K/uL Final  . RBC 05/12/2015 4.73  3.80 - 5.20 MIL/uL Final  . Hemoglobin 05/12/2015 14.2  12.0 - 16.0 g/dL Final  . HCT 05/12/2015 42.0  35.0 - 47.0 % Final  . MCV 05/12/2015 88.8  80.0 - 100.0 fL Final  . MCH 05/12/2015 30.0  26.0 - 34.0 pg Final  . MCHC 05/12/2015 33.8  32.0 - 36.0 g/dL Final  . RDW 05/12/2015 14.0  11.5 - 14.5 % Final  . Platelets 05/12/2015 396  150 - 440 K/uL Final  . Neutrophils  Relative % 05/12/2015 57   Final  . Neutro Abs 05/12/2015 3.7  1.4 - 6.5 K/uL Final  . Lymphocytes Relative 05/12/2015 30   Final  . Lymphs Abs 05/12/2015 2.0  1.0 - 3.6 K/uL Final  . Monocytes Relative 05/12/2015 9   Final  . Monocytes Absolute 05/12/2015 0.6  0.2 - 0.9 K/uL Final  . Eosinophils Relative 05/12/2015 3   Final  . Eosinophils Absolute 05/12/2015 0.2  0 - 0.7 K/uL Final  . Basophils Relative 05/12/2015 1   Final  . Basophils Absolute 05/12/2015 0.1  0 - 0.1 K/uL Final  . Sodium 05/12/2015 132* 135 - 145 mmol/L Final  . Potassium 05/12/2015 4.5  3.5 - 5.1 mmol/L Final  . Chloride 05/12/2015 102  101 - 111 mmol/L Final  . CO2 05/12/2015 25  22 - 32 mmol/L Final  . Glucose, Bld 05/12/2015 129* 65 - 99 mg/dL Final  . BUN 05/12/2015 21* 6 - 20 mg/dL Final  . Creatinine, Ser 05/12/2015 1.14* 0.44 - 1.00 mg/dL Final  . Calcium 05/12/2015 8.9  8.9 - 10.3 mg/dL Final  . GFR calc non Af Amer 05/12/2015 48* >60 mL/min Final  . GFR calc Af Amer 05/12/2015 56* >60 mL/min Final   Comment: (NOTE) The eGFR has been calculated using the CKD EPI equation. This calculation has not been validated in all clinical situations. eGFR's persistently <60  mL/min signify possible Chronic Kidney Disease.   . Anion gap 05/12/2015 5  5 - 15 Final       STUDIES: 1. 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 Slight anterior protuberance of the left sternum at the [facility] joint likely accounting for the palpable abnormality. 2. No active cardiopulmonary disease. 3. Three-vessel coronary artery atherosclerosis. Please note that although the presence of coronary artery calcium documents the presence of coronary artery disease, the severity of this disease and any potential stenosis cannot be assessed on this non-gated, non-contrast CT examination. Assessment for potential risk factor modification, dietary therapy or pharmacologic therapy may be warranted, if clinically indicated.   Electronically Signed  By: Kathreen Devoid  On: 02/17/2015 12:12  ASSESSMENT: Carcinoma breast there is no evidence of recurrent disease  MEDICAL DECISION MAKING:  Because of chest wall nodule a CT scan was done which was consistent with arthritis. Continue anastrozole  Patient expressed understanding and was in agreement with this plan. She also understands that She can call clinic at any time with any questions, concerns, or complaints.    No matching staging information was found for the patient.  Forest Gleason, MD   05/12/2015 12:16 PM

## 2015-05-18 ENCOUNTER — Encounter: Payer: Self-pay | Admitting: Oncology

## 2015-05-18 DIAGNOSIS — K449 Diaphragmatic hernia without obstruction or gangrene: Secondary | ICD-10-CM | POA: Insufficient documentation

## 2015-05-18 DIAGNOSIS — F329 Major depressive disorder, single episode, unspecified: Secondary | ICD-10-CM | POA: Insufficient documentation

## 2015-05-18 DIAGNOSIS — M1990 Unspecified osteoarthritis, unspecified site: Secondary | ICD-10-CM | POA: Insufficient documentation

## 2015-05-18 DIAGNOSIS — I10 Essential (primary) hypertension: Secondary | ICD-10-CM | POA: Insufficient documentation

## 2015-05-18 DIAGNOSIS — F32A Depression, unspecified: Secondary | ICD-10-CM | POA: Insufficient documentation

## 2015-05-18 DIAGNOSIS — D649 Anemia, unspecified: Secondary | ICD-10-CM | POA: Insufficient documentation

## 2015-05-18 DIAGNOSIS — Z9109 Other allergy status, other than to drugs and biological substances: Secondary | ICD-10-CM | POA: Insufficient documentation

## 2015-05-18 DIAGNOSIS — E669 Obesity, unspecified: Secondary | ICD-10-CM | POA: Insufficient documentation

## 2015-06-08 ENCOUNTER — Ambulatory Visit
Admission: RE | Admit: 2015-06-08 | Discharge: 2015-06-08 | Disposition: A | Discharge status: Home / Self Care | Payer: Medicare Other | Source: Ambulatory Visit | Attending: Oncology | Admitting: Oncology

## 2015-06-08 DIAGNOSIS — C50912 Malignant neoplasm of unspecified site of left female breast: Secondary | ICD-10-CM

## 2015-06-08 DIAGNOSIS — Z853 Personal history of malignant neoplasm of breast: Secondary | ICD-10-CM | POA: Insufficient documentation

## 2015-06-08 DIAGNOSIS — M8580 Other specified disorders of bone density and structure, unspecified site: Secondary | ICD-10-CM | POA: Diagnosis present

## 2015-06-08 HISTORY — DX: Malignant neoplasm of unspecified site of unspecified female breast: C50.919

## 2015-08-24 ENCOUNTER — Other Ambulatory Visit: Payer: Self-pay | Admitting: Family Medicine

## 2015-11-10 ENCOUNTER — Inpatient Hospital Stay: Payer: Medicare Other

## 2015-11-10 ENCOUNTER — Inpatient Hospital Stay: Payer: Medicare Other | Admitting: Oncology

## 2015-11-13 ENCOUNTER — Other Ambulatory Visit: Payer: Self-pay | Admitting: Internal Medicine

## 2015-11-13 DIAGNOSIS — R0600 Dyspnea, unspecified: Secondary | ICD-10-CM

## 2015-11-13 DIAGNOSIS — R0609 Other forms of dyspnea: Secondary | ICD-10-CM

## 2015-11-23 ENCOUNTER — Other Ambulatory Visit: Payer: Self-pay | Admitting: Internal Medicine

## 2015-11-23 ENCOUNTER — Ambulatory Visit
Admission: RE | Admit: 2015-11-23 | Discharge: 2015-11-23 | Disposition: A | Discharge status: Home / Self Care | Payer: Medicare Other | Source: Ambulatory Visit | Attending: Internal Medicine | Admitting: Internal Medicine

## 2015-11-23 DIAGNOSIS — K449 Diaphragmatic hernia without obstruction or gangrene: Secondary | ICD-10-CM | POA: Insufficient documentation

## 2015-11-23 DIAGNOSIS — I2699 Other pulmonary embolism without acute cor pulmonale: Secondary | ICD-10-CM

## 2015-11-23 DIAGNOSIS — I7090 Unspecified atherosclerosis: Secondary | ICD-10-CM | POA: Insufficient documentation

## 2015-11-23 DIAGNOSIS — R0609 Other forms of dyspnea: Secondary | ICD-10-CM | POA: Diagnosis present

## 2015-11-23 DIAGNOSIS — I2510 Atherosclerotic heart disease of native coronary artery without angina pectoris: Secondary | ICD-10-CM | POA: Insufficient documentation

## 2015-11-23 MED ORDER — IOHEXOL 350 MG/ML SOLN
75.0000 mL | Freq: Once | INTRAVENOUS | Status: AC | PRN
  Administered 2015-11-23: 75 mL via INTRAVENOUS

## 2015-11-25 ENCOUNTER — Ambulatory Visit
Admission: RE | Admit: 2015-11-25 | Discharge: 2015-11-25 | Disposition: A | Discharge status: Home / Self Care | Payer: Medicare Other | Source: Ambulatory Visit | Attending: Internal Medicine | Admitting: Internal Medicine

## 2015-11-25 DIAGNOSIS — I2699 Other pulmonary embolism without acute cor pulmonale: Secondary | ICD-10-CM | POA: Diagnosis present

## 2016-01-14 ENCOUNTER — Ambulatory Visit: Payer: Medicare Other | Admitting: Oncology

## 2016-01-14 ENCOUNTER — Inpatient Hospital Stay: Payer: Medicare Other | Admitting: Family Medicine

## 2016-01-14 ENCOUNTER — Other Ambulatory Visit: Payer: Medicare Other

## 2016-01-14 ENCOUNTER — Inpatient Hospital Stay: Payer: Medicare Other

## 2016-01-28 ENCOUNTER — Ambulatory Visit: Payer: Medicare Other | Admitting: Family Medicine

## 2016-01-28 ENCOUNTER — Other Ambulatory Visit: Payer: Medicare Other

## 2016-02-02 ENCOUNTER — Inpatient Hospital Stay (HOSPITAL_BASED_OUTPATIENT_CLINIC_OR_DEPARTMENT_OTHER): Payer: Medicare Other | Admitting: Family Medicine

## 2016-02-02 ENCOUNTER — Other Ambulatory Visit: Payer: Medicare Other

## 2016-02-02 ENCOUNTER — Inpatient Hospital Stay: Payer: Medicare Other | Attending: Family Medicine

## 2016-02-02 ENCOUNTER — Ambulatory Visit: Payer: Medicare Other | Admitting: Family Medicine

## 2016-02-02 VITALS — BP 150/79 | HR 88 | Temp 97.5°F | Resp 18 | Wt 244.5 lb

## 2016-02-02 DIAGNOSIS — Z7901 Long term (current) use of anticoagulants: Secondary | ICD-10-CM

## 2016-02-02 DIAGNOSIS — C50412 Malignant neoplasm of upper-outer quadrant of left female breast: Secondary | ICD-10-CM | POA: Insufficient documentation

## 2016-02-02 DIAGNOSIS — Z7982 Long term (current) use of aspirin: Secondary | ICD-10-CM | POA: Insufficient documentation

## 2016-02-02 DIAGNOSIS — Z79811 Long term (current) use of aromatase inhibitors: Secondary | ICD-10-CM | POA: Diagnosis not present

## 2016-02-02 DIAGNOSIS — I10 Essential (primary) hypertension: Secondary | ICD-10-CM | POA: Diagnosis not present

## 2016-02-02 DIAGNOSIS — Z170 Estrogen receptor positive status [ER+]: Secondary | ICD-10-CM | POA: Diagnosis not present

## 2016-02-02 DIAGNOSIS — R300 Dysuria: Secondary | ICD-10-CM | POA: Insufficient documentation

## 2016-02-02 DIAGNOSIS — Z86711 Personal history of pulmonary embolism: Secondary | ICD-10-CM | POA: Insufficient documentation

## 2016-02-02 DIAGNOSIS — C50912 Malignant neoplasm of unspecified site of left female breast: Secondary | ICD-10-CM

## 2016-02-02 DIAGNOSIS — I2699 Other pulmonary embolism without acute cor pulmonale: Secondary | ICD-10-CM | POA: Insufficient documentation

## 2016-02-02 DIAGNOSIS — R531 Weakness: Secondary | ICD-10-CM | POA: Insufficient documentation

## 2016-02-02 DIAGNOSIS — G8929 Other chronic pain: Secondary | ICD-10-CM | POA: Diagnosis not present

## 2016-02-02 DIAGNOSIS — R1031 Right lower quadrant pain: Secondary | ICD-10-CM | POA: Insufficient documentation

## 2016-02-02 DIAGNOSIS — M8580 Other specified disorders of bone density and structure, unspecified site: Secondary | ICD-10-CM

## 2016-02-02 DIAGNOSIS — R5383 Other fatigue: Secondary | ICD-10-CM | POA: Insufficient documentation

## 2016-02-02 DIAGNOSIS — M1990 Unspecified osteoarthritis, unspecified site: Secondary | ICD-10-CM | POA: Insufficient documentation

## 2016-02-02 DIAGNOSIS — Z79899 Other long term (current) drug therapy: Secondary | ICD-10-CM | POA: Insufficient documentation

## 2016-02-02 DIAGNOSIS — M545 Low back pain: Secondary | ICD-10-CM | POA: Diagnosis not present

## 2016-02-02 LAB — URINALYSIS COMPLETE WITH MICROSCOPIC (ARMC ONLY)
Bacteria, UA: NONE SEEN
Bilirubin Urine: NEGATIVE
Glucose, UA: NEGATIVE mg/dL
KETONES UR: NEGATIVE mg/dL
Nitrite: NEGATIVE
PH: 5 (ref 5.0–8.0)
PROTEIN: NEGATIVE mg/dL
Specific Gravity, Urine: 1.019 (ref 1.005–1.030)

## 2016-02-02 LAB — CBC WITH DIFFERENTIAL/PLATELET
BASOS ABS: 0.1 10*3/uL (ref 0–0.1)
BASOS PCT: 1 %
EOS ABS: 0.2 10*3/uL (ref 0–0.7)
EOS PCT: 4 %
HEMATOCRIT: 39.2 % (ref 35.0–47.0)
Hemoglobin: 13.4 g/dL (ref 12.0–16.0)
Lymphocytes Relative: 30 %
Lymphs Abs: 1.8 10*3/uL (ref 1.0–3.6)
MCH: 30.5 pg (ref 26.0–34.0)
MCHC: 34.3 g/dL (ref 32.0–36.0)
MCV: 89.1 fL (ref 80.0–100.0)
MONO ABS: 0.5 10*3/uL (ref 0.2–0.9)
MONOS PCT: 9 %
Neutro Abs: 3.4 10*3/uL (ref 1.4–6.5)
Neutrophils Relative %: 56 %
PLATELETS: 344 10*3/uL (ref 150–440)
RBC: 4.4 MIL/uL (ref 3.80–5.20)
RDW: 13.8 % (ref 11.5–14.5)
WBC: 6.1 10*3/uL (ref 3.6–11.0)

## 2016-02-02 LAB — COMPREHENSIVE METABOLIC PANEL
ALBUMIN: 4.3 g/dL (ref 3.5–5.0)
ALT: 52 U/L (ref 14–54)
ANION GAP: 9 (ref 5–15)
AST: 49 U/L — ABNORMAL HIGH (ref 15–41)
Alkaline Phosphatase: 101 U/L (ref 38–126)
BILIRUBIN TOTAL: 0.4 mg/dL (ref 0.3–1.2)
BUN: 22 mg/dL — ABNORMAL HIGH (ref 6–20)
CHLORIDE: 106 mmol/L (ref 101–111)
CO2: 25 mmol/L (ref 22–32)
Calcium: 9.8 mg/dL (ref 8.9–10.3)
Creatinine, Ser: 0.92 mg/dL (ref 0.44–1.00)
GFR calc Af Amer: >60 mL/min (ref >60)
GFR calc non Af Amer: >60 mL/min (ref >60)
GLUCOSE: 126 mg/dL — AB (ref 65–99)
POTASSIUM: 3.9 mmol/L (ref 3.5–5.1)
SODIUM: 140 mmol/L (ref 135–145)
TOTAL PROTEIN: 7.2 g/dL (ref 6.5–8.1)

## 2016-02-02 NOTE — Progress Notes (Signed)
States is having back pain today that has been chronic. Taking tylenol and using heat alternating with ice to help relieve pain. Had pulmonary embolus since last visit and was started on xarelto.

## 2016-02-02 NOTE — Progress Notes (Signed)
[place]  [phone]) [phone]  Fax:(336) [phone]     Beth Hoover DOB: 1946/08/17  MR#: [id number]  AYT#:[id number]  Patient Care Team: Idelle Crouch, MD as PCP - General (Internal Medicine)  CHIEF COMPLAINT:  Chief Complaint  Patient presents with  . Breast Cancer    INTERVAL HISTORY:  Patient is here for further follow-up regarding carcinoma of the left breast. Patient was last evaluated in August 2016 by Dr. Oliva Bustard. She is currently on Arimidex and tolerating very well. She does report having some fatigue and malaise as well as chronic lower back pain that sounds arthritic in nature. Patient is also having right lower quadrant to mid lower abdominal intermittent cramping. She had a hysterectomy many years ago and has been left with one ovary. Patient was also diagnosed with pulmonary embolism in March 2017 by her PCP and was started on Xarelto. She had a mammogram performed in September 2016 that was reported as negative, BI-RADS 1. Bone density was performed in September as well and also shows some osteopenia.  REVIEW OF SYSTEMS:   Review of Systems  Constitutional: Positive for malaise/fatigue. Negative for fever, chills, weight loss and diaphoresis.  HENT: Negative.   Eyes: Negative.   Respiratory: Negative for cough, hemoptysis, sputum production, shortness of breath and wheezing.        Pulmonary embolism in March 2017  Cardiovascular: Negative for chest pain, palpitations, orthopnea, claudication, leg swelling and PND.  Gastrointestinal: Positive for abdominal pain. Negative for heartburn, nausea, vomiting, diarrhea, constipation, blood in stool and melena.       Lower mid to right lower quadrant  Genitourinary: Negative.   Musculoskeletal: Positive for back pain.  Skin: Negative.   Neurological: Negative for dizziness, tingling, focal weakness, seizures and weakness.  Endo/Heme/Allergies: Does not bruise/bleed easily.  Psychiatric/Behavioral:  Negative for depression. The patient is not nervous/anxious and does not have insomnia.     As per HPI. Otherwise, a complete review of systems is negatve.  ONCOLOGY HISTORY:   Breast CA ([place])   06/25/2013 Initial Diagnosis Breast CA ([place]), left breast, T1a N0 M0, stage IA ER/PR positive HER-2 receptor-negative by [facility]   06/25/2013 Surgery Left partial mastectomy    - 09/12/2013 Radiation Therapy Completed radiation    - 12/11/2013 Anti-estrogen oral therapy Discontinue letrozole due to side effect   12/11/2013 -  Anti-estrogen oral therapy Started Arimidex    PAST MEDICAL HISTORY: Past Medical History  Diagnosis Date  . Cancer 2014    Left Breast CA with Lumpectomy and 36 Rad tx's.  Marland Kitchen Hypertension   . Breast cancer 2014    left breast with rad tx  Pulmonary embolism in March 2017.  PAST SURGICAL HISTORY: Past Surgical History  Procedure Laterality Date  . Breast biopsy Left 06/06/2013    positive    FAMILY HISTORY Family History  Problem Relation Age of Onset  . Breast cancer Maternal Aunt     60's    GYNECOLOGIC HISTORY:  No LMP recorded. Patient has had a hysterectomy.     ADVANCED DIRECTIVES:    HEALTH MAINTENANCE: Social History  Substance Use Topics  . Smoking status: Never Smoker   . Smokeless tobacco: Not on file  . Alcohol Use: Not on file     Colonoscopy:  PAP:  Bone density:05/2015  Mammogram:05/2015  Allergies  Allergen Reactions  . Actifed Cold-Allergy [Chlorpheniramine-Phenylephrine] Palpitations  . Septra [Sulfamethoxazole-Trimethoprim] Rash  . Sulfa Antibiotics Rash    Current Outpatient Prescriptions  Medication  Sig Dispense Refill  . acetaminophen (TYLENOL) 500 MG tablet Take 500 mg by mouth every 6 (six) hours as needed.    Marland Kitchen anastrozole (ARIMIDEX) 1 MG tablet Take 1 tablet by mouth once a day 90 tablet 3  . aspirin EC 81 MG tablet Take 81 mg by mouth daily.     Marland Kitchen atorvastatin (LIPITOR) 40 MG tablet Take 40 mg by mouth daily at 6 PM.      . hydrochlorothiazide (HYDRODIURIL) 25 MG tablet Take 25 mg by mouth as needed.     . metoprolol (LOPRESSOR) 50 MG tablet Take 50 mg by mouth daily.     . Multiple Vitamin (MULTI-VITAMINS) TABS Take 1 tablet by mouth daily.     . pantoprazole (PROTONIX) 40 MG tablet Take 40 mg by mouth 2 (two) times daily.     . rivaroxaban (XARELTO) 20 MG TABS tablet Take 1 tablet by mouth daily.    Marland Kitchen venlafaxine XR (EFFEXOR-XR) 150 MG 24 hr capsule Take 150 mg by mouth daily with breakfast.     . zolpidem (AMBIEN) 10 MG tablet Take 10 mg by mouth at bedtime.      No current facility-administered medications for this visit.    OBJECTIVE: BP 150/79 mmHg  Pulse 88  Temp(Src) 97.5 F (36.4 C) (Tympanic)  Resp 18  Wt 244 lb 7.8 oz (110.9 kg)   There is no height on file to calculate BMI.    ECOG FS:1 - Symptomatic but completely ambulatory  General: Well-developed, well-nourished, no acute distress. Eyes: Pink conjunctiva, anicteric sclera. HEENT: Normocephalic, moist mucous membranes, clear oropharnyx. Lungs: Clear to auscultation bilaterally. Heart: Regular rate and rhythm. No rubs, murmurs, or gallops. Abdomen: Soft, right lower quadrant tenderness, nondistended. No organomegaly noted, normoactive bowel sounds. Breast: Breast palpated in a circular manner in the sitting and supine positions.  No masses or fullness palpated.  Axilla palpated in both positions with no masses or fullness palpated.  Musculoskeletal: No edema, cyanosis, or clubbing. Neuro: Alert, answering all questions appropriately. Cranial nerves grossly intact. Skin: No rashes or petechiae noted. Psych: Normal affect. Lymphatics: No cervical, clavicular, or axillary LAD.   LAB RESULTS:  Appointment on 02/02/2016  Component Date Value Ref Range Status  . WBC 02/02/2016 6.1  3.6 - 11.0 K/uL Final  . RBC 02/02/2016 4.40  3.80 - 5.20 MIL/uL Final  . Hemoglobin 02/02/2016 13.4  12.0 - 16.0 g/dL Final  . HCT 02/02/2016 39.2  35.0  - 47.0 % Final  . MCV 02/02/2016 89.1  80.0 - 100.0 fL Final  . MCH 02/02/2016 30.5  26.0 - 34.0 pg Final  . MCHC 02/02/2016 34.3  32.0 - 36.0 g/dL Final  . RDW 02/02/2016 13.8  11.5 - 14.5 % Final  . Platelets 02/02/2016 344  150 - 440 K/uL Final  . Neutrophils Relative % 02/02/2016 56   Final  . Neutro Abs 02/02/2016 3.4  1.4 - 6.5 K/uL Final  . Lymphocytes Relative 02/02/2016 30   Final  . Lymphs Abs 02/02/2016 1.8  1.0 - 3.6 K/uL Final  . Monocytes Relative 02/02/2016 9   Final  . Monocytes Absolute 02/02/2016 0.5  0.2 - 0.9 K/uL Final  . Eosinophils Relative 02/02/2016 4   Final  . Eosinophils Absolute 02/02/2016 0.2  0 - 0.7 K/uL Final  . Basophils Relative 02/02/2016 1   Final  . Basophils Absolute 02/02/2016 0.1  0 - 0.1 K/uL Final  . Sodium 02/02/2016 140  135 - 145 mmol/L Final  .  Potassium 02/02/2016 3.9  3.5 - 5.1 mmol/L Final  . Chloride 02/02/2016 106  101 - 111 mmol/L Final  . CO2 02/02/2016 25  22 - 32 mmol/L Final  . Glucose, Bld 02/02/2016 126* 65 - 99 mg/dL Final  . BUN 02/02/2016 22* 6 - 20 mg/dL Final  . Creatinine, Ser 02/02/2016 0.92  0.44 - 1.00 mg/dL Final  . Calcium 02/02/2016 9.8  8.9 - 10.3 mg/dL Final  . Total Protein 02/02/2016 7.2  6.5 - 8.1 g/dL Final  . Albumin 02/02/2016 4.3  3.5 - 5.0 g/dL Final  . AST 02/02/2016 49* 15 - 41 U/L Final  . ALT 02/02/2016 52  14 - 54 U/L Final  . Alkaline Phosphatase 02/02/2016 101  38 - 126 U/L Final  . Total Bilirubin 02/02/2016 0.4  0.3 - 1.2 mg/dL Final  . GFR calc non Af Amer 02/02/2016 >60  >60 mL/min Final  . GFR calc Af Amer 02/02/2016 >60  >60 mL/min Final   Comment: (NOTE) The eGFR has been calculated using the CKD EPI equation. This calculation has not been validated in all clinical situations. eGFR's persistently <60 mL/min signify possible Chronic Kidney Disease.   . Anion gap 02/02/2016 9  5 - 15 Final    STUDIES: No results found.  ASSESSMENT:  Carcinoma of left breast, T1a N0M0, stage IA,  ER/PR positive, HER-2 negative by FISH.  PLAN:   1. Left breast cancer. Patient is status post partial left mastectomy from October 2014. Patient also completed radiation therapy and was started on letrozole but letrozole was discontinued due to side effects in April 2015. Patient has since been on Arimidex and is tolerating very well. 2. PE. Patient is currently being treated for pulmonary embolism that was found in March 2017. Patient is on Xarelto which will be continued until July 2017. 3. Lower back pain. Patient states that lower back pain is worse first thing in the morning and continues to improve throughout the day, she has been taking Tylenol arthritis when necessary. Most likely related to arthritis. We'll obtain lumbar fields to confirm. 4. Lower abdominal and right lower quadrant pelvic discomfort. Patient has undergone hysterectomy in past and has 1 ovary remaining, patient is unsure which side. Discussed this patient is having this intermittent pain for about several months we will obtain ultrasound of the lower abdominal area for further evaluation. Patient also having some burning with urination, UA and culture will be sent.  Patient will return in approximately 6 months for continued evaluation. Mammogram to be scheduled.  Patient expressed understanding and was in agreement with this plan. She also understands that She can call clinic at any time with any questions, concerns, or complaints.   Dr. Grayland Ormond was available for consultation and review of plan of care for this patient.  Breast CA [facility])   Staging form: Breast, AJCC 7th Edition     Clinical stage from 06/25/2013: Stage IA (T1a, N0, M0) - Signed by Evlyn Kanner, NP on 02/02/2016   Evlyn Kanner, NP   02/02/2016 3:22 PM

## 2016-02-03 ENCOUNTER — Other Ambulatory Visit: Payer: Self-pay | Admitting: Family Medicine

## 2016-02-03 DIAGNOSIS — R319 Hematuria, unspecified: Secondary | ICD-10-CM

## 2016-02-03 DIAGNOSIS — R309 Painful micturition, unspecified: Secondary | ICD-10-CM

## 2016-02-04 ENCOUNTER — Telehealth: Payer: Self-pay | Admitting: *Deleted

## 2016-02-04 ENCOUNTER — Other Ambulatory Visit: Payer: Self-pay | Admitting: Family Medicine

## 2016-02-04 DIAGNOSIS — R1031 Right lower quadrant pain: Secondary | ICD-10-CM

## 2016-02-04 DIAGNOSIS — C50919 Malignant neoplasm of unspecified site of unspecified female breast: Secondary | ICD-10-CM

## 2016-02-04 MED ORDER — CIPROFLOXACIN HCL 500 MG PO TABS: 500.0000 mg | ORAL_TABLET | Freq: Two times a day (BID) | ORAL | Status: DC

## 2016-02-04 NOTE — Telephone Encounter (Addendum)
Calling to clarify exactly what you are looking for, Abd [place] looks st liver pancreas area and they are wandering if you really want a pelvic US with her lower abd pain

## 2016-02-04 NOTE — Telephone Encounter (Signed)
Called to states she was told she has blood in her urine and is asking what needs to be done about it

## 2016-02-04 NOTE — Telephone Encounter (Signed)
Per Dr Oliva Bustard, US pelvis. [place] dept informed and said they will change order

## 2016-02-07 LAB — URINE CULTURE: CULTURE: NO GROWTH

## 2016-02-10 ENCOUNTER — Other Ambulatory Visit: Payer: Self-pay | Admitting: Internal Medicine

## 2016-02-10 DIAGNOSIS — R319 Hematuria, unspecified: Secondary | ICD-10-CM

## 2016-02-15 ENCOUNTER — Telehealth: Payer: Self-pay | Admitting: *Deleted

## 2016-02-15 ENCOUNTER — Telehealth: Payer: Self-pay | Admitting: Family Medicine

## 2016-02-15 ENCOUNTER — Other Ambulatory Visit: Payer: Self-pay | Admitting: Family Medicine

## 2016-02-15 NOTE — Telephone Encounter (Signed)
Would like a return phone call to Olivia Mackie at [facility] to discuss patient. Please call.

## 2016-02-15 NOTE — Telephone Encounter (Signed)
Contacted Olivia Mackie at [facility]. Information was given as requested. Note that was faxed with referral was old f/u note from December 2016.

## 2016-02-18 ENCOUNTER — Ambulatory Visit: Admission: RE | Admit: 2016-02-18 | Payer: Medicare Other | Source: Ambulatory Visit

## 2016-02-18 ENCOUNTER — Ambulatory Visit
Admission: RE | Admit: 2016-02-18 | Discharge: 2016-02-18 | Disposition: A | Discharge status: Home / Self Care | Payer: Medicare Other | Source: Ambulatory Visit | Attending: Internal Medicine | Admitting: Internal Medicine

## 2016-02-18 DIAGNOSIS — R319 Hematuria, unspecified: Secondary | ICD-10-CM | POA: Insufficient documentation

## 2016-02-19 ENCOUNTER — Ambulatory Visit
Admission: RE | Admit: 2016-02-19 | Discharge: 2016-02-19 | Disposition: A | Discharge status: Home / Self Care | Payer: Medicare Other | Source: Ambulatory Visit | Attending: Family Medicine | Admitting: Family Medicine

## 2016-02-19 DIAGNOSIS — R1031 Right lower quadrant pain: Secondary | ICD-10-CM | POA: Diagnosis present

## 2016-02-19 DIAGNOSIS — C50919 Malignant neoplasm of unspecified site of unspecified female breast: Secondary | ICD-10-CM

## 2016-02-26 ENCOUNTER — Ambulatory Visit
Admission: RE | Admit: 2016-02-26 | Discharge: 2016-02-26 | Disposition: A | Discharge status: Home / Self Care | Payer: Medicare Other | Source: Ambulatory Visit | Attending: Family Medicine | Admitting: Family Medicine

## 2016-02-26 DIAGNOSIS — I700 Atherosclerosis of aorta: Secondary | ICD-10-CM | POA: Insufficient documentation

## 2016-02-26 DIAGNOSIS — C50412 Malignant neoplasm of upper-outer quadrant of left female breast: Secondary | ICD-10-CM | POA: Insufficient documentation

## 2016-04-27 DIAGNOSIS — R3915 Urgency of urination: Secondary | ICD-10-CM | POA: Insufficient documentation

## 2016-04-27 DIAGNOSIS — R3129 Other microscopic hematuria: Secondary | ICD-10-CM | POA: Insufficient documentation

## 2016-05-10 DIAGNOSIS — N952 Postmenopausal atrophic vaginitis: Secondary | ICD-10-CM | POA: Insufficient documentation

## 2016-05-10 DIAGNOSIS — D414 Neoplasm of uncertain behavior of bladder: Secondary | ICD-10-CM | POA: Insufficient documentation

## 2016-05-10 DIAGNOSIS — Q625 Duplication of ureter: Secondary | ICD-10-CM | POA: Insufficient documentation

## 2016-05-20 ENCOUNTER — Other Ambulatory Visit: Payer: Self-pay | Admitting: Internal Medicine

## 2016-05-20 DIAGNOSIS — R0602 Shortness of breath: Secondary | ICD-10-CM

## 2016-05-20 DIAGNOSIS — M545 Low back pain: Secondary | ICD-10-CM

## 2016-06-06 ENCOUNTER — Ambulatory Visit
Admission: RE | Admit: 2016-06-06 | Discharge: 2016-06-06 | Disposition: A | Discharge status: Home / Self Care | Payer: Medicare Other | Source: Ambulatory Visit | Attending: Internal Medicine | Admitting: Internal Medicine

## 2016-06-06 DIAGNOSIS — J9811 Atelectasis: Secondary | ICD-10-CM | POA: Diagnosis not present

## 2016-06-06 DIAGNOSIS — M545 Low back pain: Secondary | ICD-10-CM | POA: Diagnosis present

## 2016-06-06 DIAGNOSIS — M4806 Spinal stenosis, lumbar region: Secondary | ICD-10-CM | POA: Insufficient documentation

## 2016-06-06 DIAGNOSIS — K760 Fatty (change of) liver, not elsewhere classified: Secondary | ICD-10-CM | POA: Diagnosis not present

## 2016-06-06 DIAGNOSIS — K449 Diaphragmatic hernia without obstruction or gangrene: Secondary | ICD-10-CM | POA: Insufficient documentation

## 2016-06-06 DIAGNOSIS — M1288 Other specific arthropathies, not elsewhere classified, other specified site: Secondary | ICD-10-CM | POA: Diagnosis not present

## 2016-06-06 DIAGNOSIS — Z853 Personal history of malignant neoplasm of breast: Secondary | ICD-10-CM | POA: Diagnosis not present

## 2016-06-06 DIAGNOSIS — R0602 Shortness of breath: Secondary | ICD-10-CM | POA: Diagnosis present

## 2016-06-06 DIAGNOSIS — I700 Atherosclerosis of aorta: Secondary | ICD-10-CM | POA: Diagnosis not present

## 2016-06-06 DIAGNOSIS — M5134 Other intervertebral disc degeneration, thoracic region: Secondary | ICD-10-CM | POA: Diagnosis not present

## 2016-06-06 DIAGNOSIS — Z86711 Personal history of pulmonary embolism: Secondary | ICD-10-CM | POA: Insufficient documentation

## 2016-06-06 MED ORDER — IOPAMIDOL (ISOVUE-370) INJECTION 76%
75.0000 mL | Freq: Once | INTRAVENOUS | Status: AC | PRN
  Administered 2016-06-06: 75 mL via INTRAVENOUS

## 2016-06-07 IMAGING — CT CT ANGIO CHEST
2 of 6 series · 18 of 46 positions shown · IV contrast (omnipaque)
Comparison: 02/17/2015 and the plain film report of 11/04/2015.

CLINICAL DATA: Shortness of breath with exertion. Left-sided breast
cancer with lumpectomy. Hypertension. Slight cough and congestion
over the last couple months.

EXAM:
CT ANGIOGRAPHY CHEST WITH CONTRAST
TECHNIQUE: Multidetector CT imaging of the chest was performed using the
standard protocol during bolus administration of intravenous
contrast. Multiplanar CT image reconstructions and MIPs were
obtained to evaluate the vascular anatomy.
CONTRAST:  75mL OMNIPAQUE IOHEXOL 350 MG/ML SOLN

[Series 8: pe thins 1.5 · axial · 0.68mm/px · z∈[-675,-416]mm · 15 of 242 slices shown]
[im 13/242  lung]
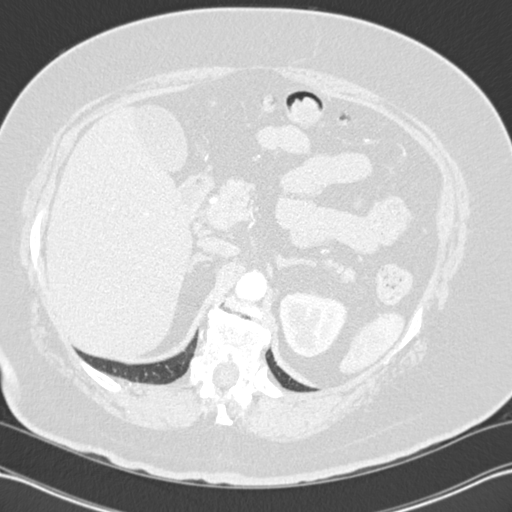
[im 26/242  soft-tissue]
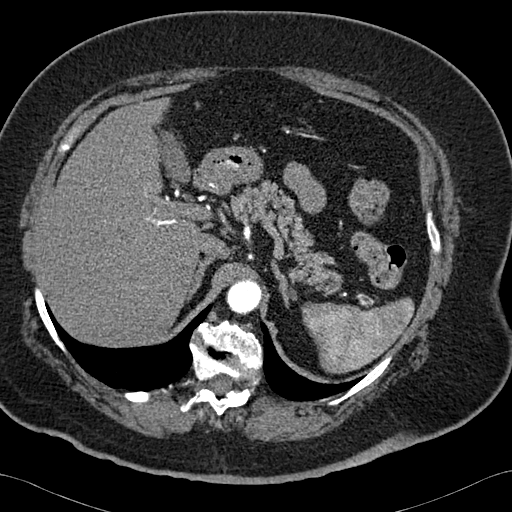
[im 51/242  lung]
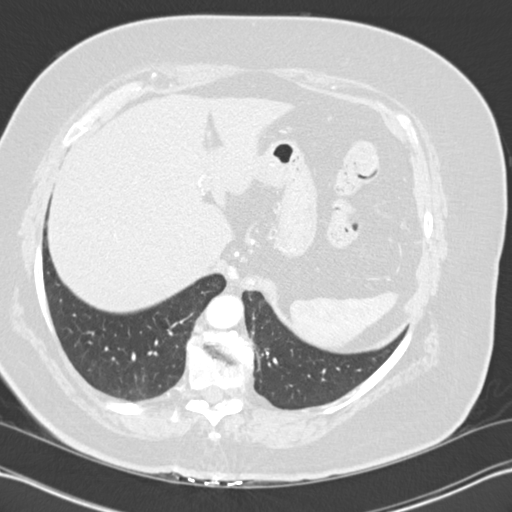
[im 64/242  soft-tissue]
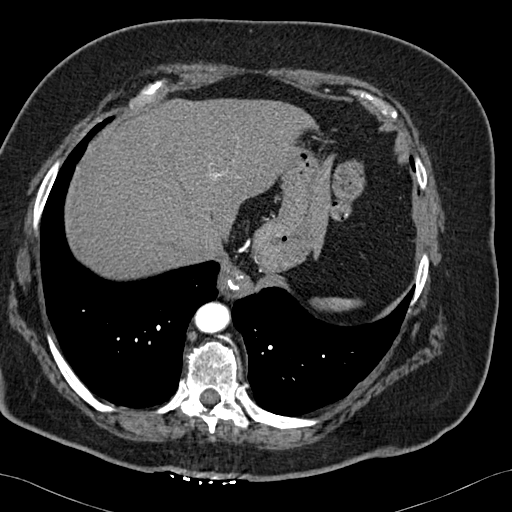
[im 77/242  lung]
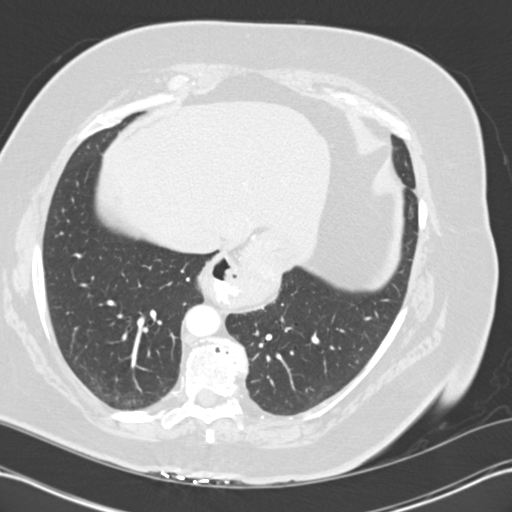
[im 89/242  soft-tissue]
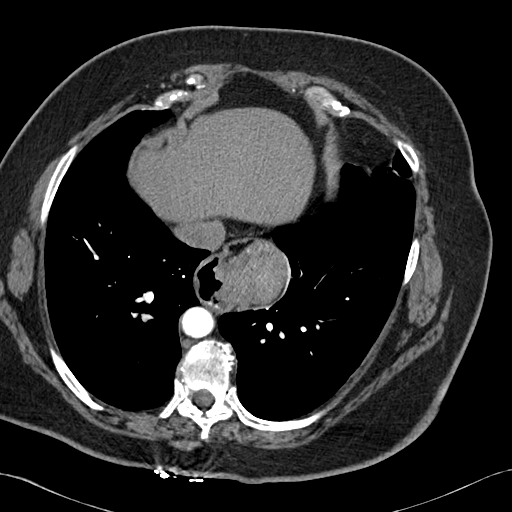
[im 102/242  lung]
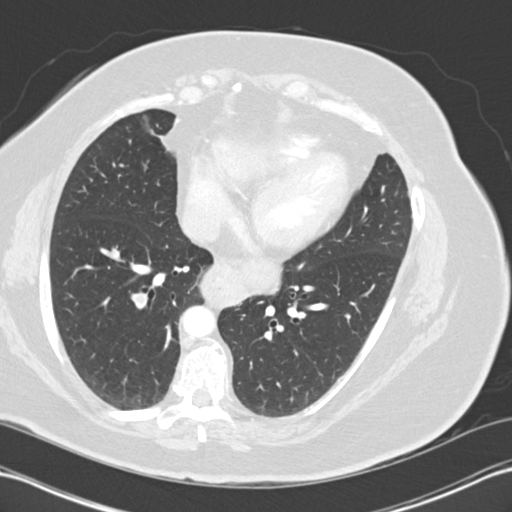
[im 127/242  soft-tissue]
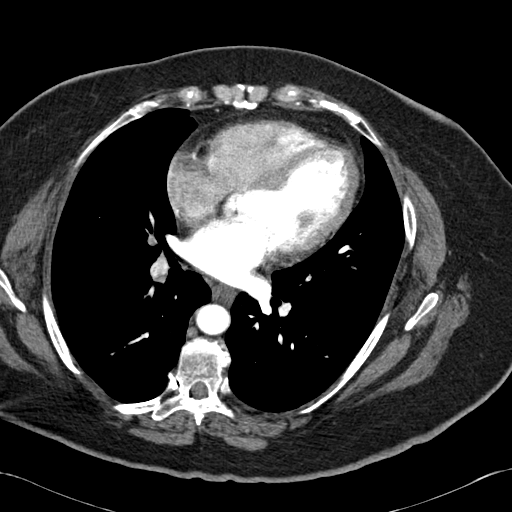
[im 140/242  lung]
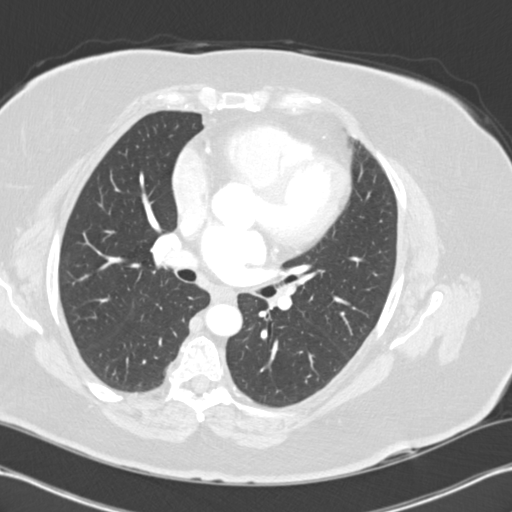
[im 153/242  soft-tissue]
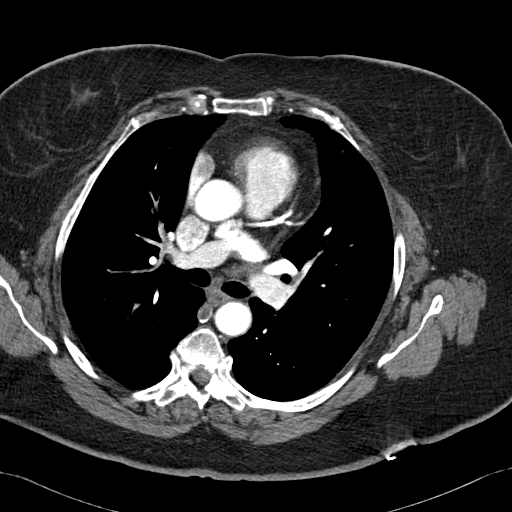
[im 165/242  lung]
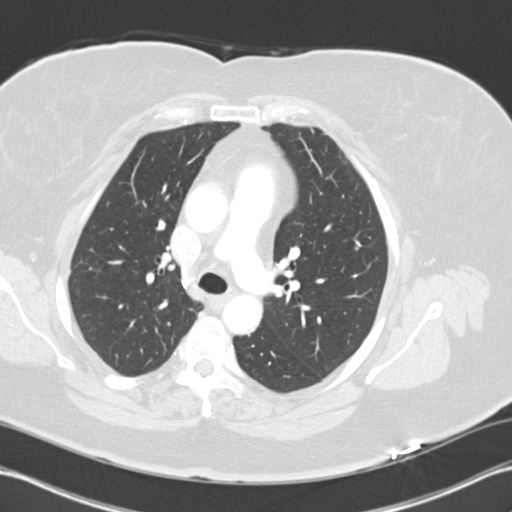
[im 178/242  soft-tissue]
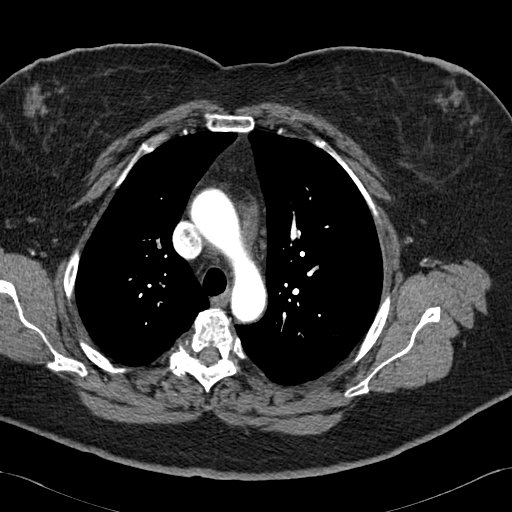
[im 203/242  lung]
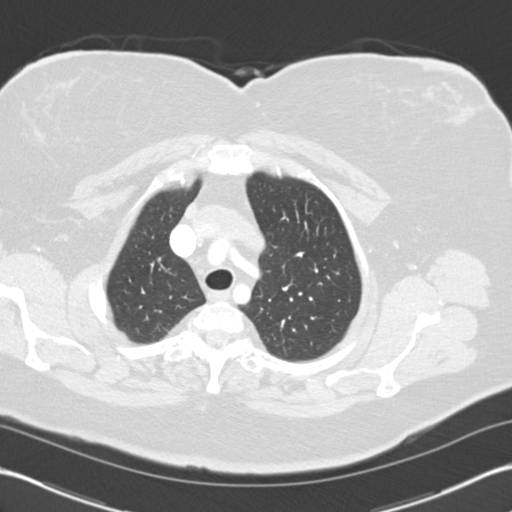
[im 216/242  soft-tissue]
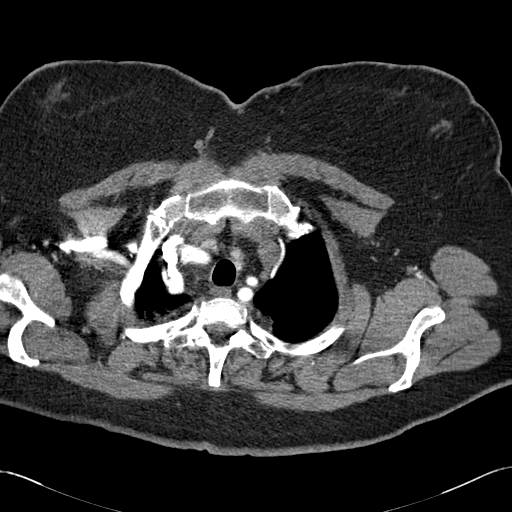
[im 229/242  lung]
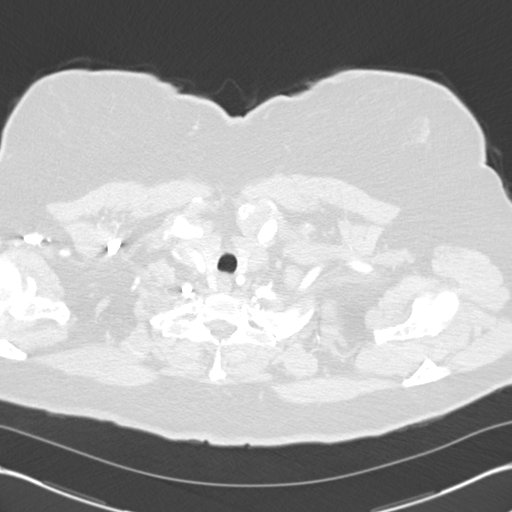

[Series 10: cor mpr 2.0 · coronal · 0.59mm/px · 3 of 143 slices shown]
[im 36/143  soft-tissue]
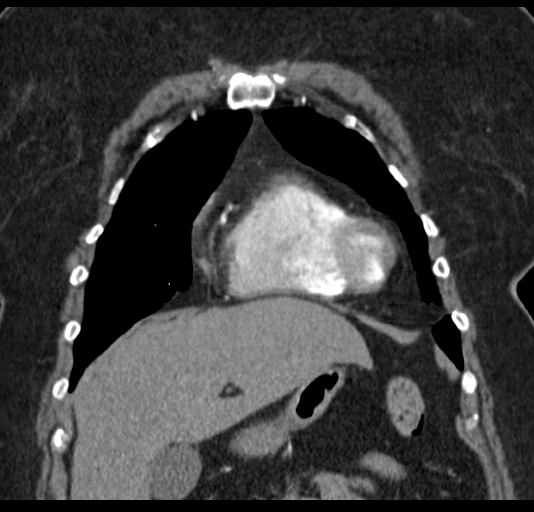
[im 72/143  soft-tissue]
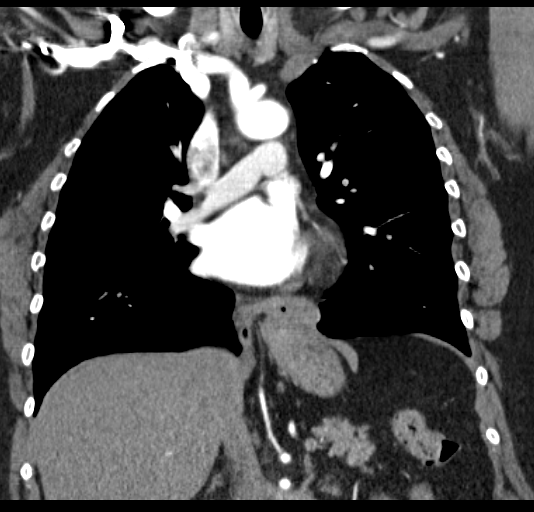
[im 107/143  soft-tissue]
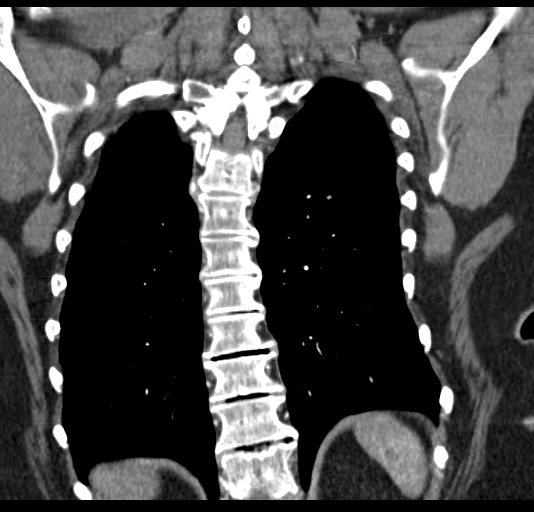

[18 of 46 positions shown; findings below may reference images not displayed]

FINDINGS: Mediastinum/Nodes: The quality of this exam for evaluation of
pulmonary embolism is sufficient. Bilateral lower lobe predominant
nonocclusive lobar and segmental pulmonary emboli. Examples image
78/ series 7 and image 82/series 7.

The RV-LV ratio is approximately 1.05.

No axillary adenopathy. Bovine arch. Tortuous descending thoracic
aorta. Mild cardiomegaly. Multivessel coronary artery
atherosclerosis. No mediastinal or hilar adenopathy. A moderate
hiatal hernia.

Lungs/Pleura: No pleural fluid.  Clear lungs.

Upper abdomen: Normal imaged portions of the liver, spleen,
pancreas, adrenal glands, kidneys, gallbladder.

Musculoskeletal: Moderate thoracic spondylosis.

Review of the MIP images confirms the above findings.
IMPRESSION: 1. Relatively large burden bilateral pulmonary emboli. Positive for
acute PE with CT evidence of right heart strain (RV/LV Ratio = 1.05)
consistent with at least submassive (intermediate risk) PE. The
presence of right heart strain has been associated with an increased
risk of morbidity and mortality. Please activate Code PE by paging
[phone].

2. These results will be called to the ordering clinician or
representative by the Radiologist Assistant, and communication
documented in the PACS or zVision Dashboard.
3.  Atherosclerosis, including within the coronary arteries.
4. Hiatal hernia.

## 2016-06-09 ENCOUNTER — Ambulatory Visit: Payer: Medicare Other | Attending: Family Medicine

## 2016-06-09 ENCOUNTER — Other Ambulatory Visit: Payer: Medicare Other

## 2016-06-13 ENCOUNTER — Other Ambulatory Visit: Payer: Self-pay | Admitting: *Deleted

## 2016-06-13 ENCOUNTER — Telehealth: Payer: Self-pay | Admitting: *Deleted

## 2016-06-13 NOTE — Telephone Encounter (Signed)
She missed her appt 06/09/16, I spoke with Tia to reschedule it and call her with the date and time

## 2016-06-13 NOTE — Telephone Encounter (Signed)
Needs an order for mammogram to be done, her last one was in Sept 2016. She is on covering provider schedule for November, you are on call, please advise

## 2016-06-13 NOTE — Telephone Encounter (Signed)
error 

## 2016-06-28 ENCOUNTER — Other Ambulatory Visit: Payer: Medicare Other

## 2016-06-28 ENCOUNTER — Ambulatory Visit: Payer: Medicare Other

## 2016-07-04 ENCOUNTER — Other Ambulatory Visit: Payer: Self-pay | Admitting: Internal Medicine

## 2016-07-04 ENCOUNTER — Ambulatory Visit
Admission: RE | Admit: 2016-07-04 | Discharge: 2016-07-04 | Disposition: A | Discharge status: Home / Self Care | Payer: Medicare Other | Source: Ambulatory Visit | Attending: Family Medicine | Admitting: Family Medicine

## 2016-07-04 ENCOUNTER — Other Ambulatory Visit: Payer: Self-pay | Admitting: Family Medicine

## 2016-07-04 ENCOUNTER — Ambulatory Visit
Admission: RE | Admit: 2016-07-04 | Discharge: 2016-07-04 | Disposition: A | Discharge status: Home / Self Care | Payer: Medicare Other | Source: Ambulatory Visit | Attending: Internal Medicine | Admitting: Internal Medicine

## 2016-07-04 DIAGNOSIS — C50412 Malignant neoplasm of upper-outer quadrant of left female breast: Secondary | ICD-10-CM | POA: Diagnosis present

## 2016-08-10 ENCOUNTER — Inpatient Hospital Stay: Payer: Medicare Other | Admitting: Oncology

## 2016-08-10 ENCOUNTER — Inpatient Hospital Stay: Payer: Medicare Other

## 2016-08-11 ENCOUNTER — Ambulatory Visit: Payer: Medicare Other

## 2016-08-11 ENCOUNTER — Ambulatory Visit: Payer: Medicare Other | Admitting: Family Medicine

## 2016-08-11 ENCOUNTER — Other Ambulatory Visit: Payer: Medicare Other

## 2016-08-24 ENCOUNTER — Ambulatory Visit: Payer: Medicare Other | Admitting: Oncology

## 2016-08-24 ENCOUNTER — Other Ambulatory Visit: Payer: Medicare Other

## 2016-08-24 NOTE — Progress Notes (Signed)
[place]  Telephone:(336) [phone]  Fax:(336) [phone]     Beth Hoover DOB: December 14, 1945  MR#: [id number]  VVO#:[id number]  Patient Care Team: Idelle Crouch, MD as PCP - General (Internal Medicine)  CHIEF COMPLAINT: Carcinoma of upper outer quadrant of the left breast, T1a N0M0, stage IA, ER/PR positive, HER-2 negative by FISH.  INTERVAL HISTORY: Patient returns to clinic today for routine six-month follow-up for the above stated breast cancer. She continues to tolerate her Arimidex well without significant side effects. She has no neurologic complaints. She denies any recent fevers or illnesses. She has good appetite and denies weight loss. She has no chest pain or shortness of breath. She denies any nausea, vomiting, constipation, or diarrhea. She has no urinary complaints. Patient feels at her baseline and offers no specific complaints today.   REVIEW OF SYSTEMS:   Review of Systems  Constitutional: Negative.  Negative for fever, malaise/fatigue and weight loss.  Respiratory: Negative.  Negative for cough, hemoptysis and shortness of breath.   Cardiovascular: Negative.  Negative for chest pain and leg swelling.  Gastrointestinal: Negative.  Negative for abdominal pain.  Genitourinary: Negative.   Musculoskeletal: Positive for back pain.  Neurological: Negative.  Negative for weakness.  Psychiatric/Behavioral: Negative.  The patient is not nervous/anxious.     As per HPI. Otherwise, a complete review of systems is negative.  ONCOLOGY HISTORY:   Primary cancer of upper outer quadrant of left female breast ([place])   06/25/2013 Initial Diagnosis    Breast CA ([place]), left breast, T1a N0 M0, stage IA ER/PR positive HER-2 receptor-negative by [facility]      06/25/2013 Surgery    Left partial mastectomy       - 09/12/2013 Radiation Therapy    Completed radiation       - 12/11/2013 Anti-estrogen oral therapy    Discontinue letrozole due to side effect      12/11/2013 -   Anti-estrogen oral therapy    Started Arimidex       PAST MEDICAL HISTORY: Past Medical History:  Diagnosis Date  . Breast cancer ([place]) 2014   left breast with rad tx  . Cancer [facility]) 2014   Left Breast CA with Lumpectomy and 36 Rad tx's.  Marland Kitchen Hypertension   Pulmonary embolism in March 2017.  PAST SURGICAL HISTORY: Past Surgical History:  Procedure Laterality Date  . BREAST BIOPSY Left 06/06/2013   positive    FAMILY HISTORY Family History  Problem Relation Age of Onset  . Breast cancer Maternal Aunt     60's    GYNECOLOGIC HISTORY:  No LMP recorded. Patient has had a hysterectomy.     ADVANCED DIRECTIVES:    HEALTH MAINTENANCE: Social History  Substance Use Topics  . Smoking status: Never Smoker  . Smokeless tobacco: Not on file  . Alcohol use Not on file     Colonoscopy:  PAP:  Bone density:05/2015  Mammogram:05/2015  Allergies  Allergen Reactions  . Actifed Cold-Allergy [Chlorpheniramine-Phenylephrine] Palpitations  . Septra [Sulfamethoxazole-Trimethoprim] Rash  . Sulfa Antibiotics Rash    Current Outpatient Prescriptions  Medication Sig Dispense Refill  . acetaminophen (TYLENOL) 500 MG tablet Take 500 mg by mouth every 6 (six) hours as needed.    Marland Kitchen anastrozole (ARIMIDEX) 1 MG tablet Take 1 tablet by mouth once a day 90 tablet 3  . aspirin 325 MG tablet Take 325 mg by mouth daily.    Marland Kitchen atorvastatin (LIPITOR) 40 MG tablet Take 40 mg by mouth daily at  6 PM.     . hydrochlorothiazide (HYDRODIURIL) 25 MG tablet Take 25 mg by mouth as needed.     . Melatonin 5 MG CAPS Take by mouth.    . MELOXICAM PO Take 1 tablet by mouth daily.    . metoprolol (LOPRESSOR) 50 MG tablet Take 50 mg by mouth daily.     . Multiple Vitamin (MULTI-VITAMINS) TABS Take 1 tablet by mouth daily.     . pantoprazole (PROTONIX) 40 MG tablet Take 40 mg by mouth daily.     . traMADol (ULTRAM) 50 MG tablet Take by mouth every 6 (six) hours as needed.    . venlafaxine XR (EFFEXOR-XR)  150 MG 24 hr capsule Take 150 mg by mouth daily with breakfast.      No current facility-administered medications for this visit.     OBJECTIVE: BP 118/85 (BP Location: Right Arm, Patient Position: Sitting)   Pulse 65   Temp 97.7 F (36.5 C) (Tympanic)   Resp 18   Wt 241 lb 11.8 oz (109.6 kg)    There is no height or weight on file to calculate BMI.    ECOG FS:0 - Asymptomatic  General: Well-developed, well-nourished, no acute distress. Eyes: Pink conjunctiva, anicteric sclera. HEENT: Normocephalic, moist mucous membranes, clear oropharnyx. Breasts: Patient requested exam be deferred today. Lungs: Clear to auscultation bilaterally. Heart: Regular rate and rhythm. No rubs, murmurs, or gallops. Abdomen: Soft, non-tender, nondistended. No organomegaly noted, normoactive bowel sounds. Musculoskeletal: No edema, cyanosis, or clubbing. Neuro: Alert, answering all questions appropriately. Cranial nerves grossly intact. Skin: No rashes or petechiae noted. Psych: Normal affect.  LAB RESULTS:  Appointment on 08/25/2016  Component Date Value Ref Range Status  . Sodium 08/25/2016 137  135 - 145 mmol/L Final  . Potassium 08/25/2016 4.4  3.5 - 5.1 mmol/L Final  . Chloride 08/25/2016 105  101 - 111 mmol/L Final  . CO2 08/25/2016 24  22 - 32 mmol/L Final  . Glucose, Bld 08/25/2016 99  65 - 99 mg/dL Final  . BUN 69/67/8938 23* 6 - 20 mg/dL Final  . Creatinine, Ser 08/25/2016 0.91  0.44 - 1.00 mg/dL Final  . Calcium 06/28/5101 9.1  8.9 - 10.3 mg/dL Final  . Total Protein 08/25/2016 7.4  6.5 - 8.1 g/dL Final  . Albumin 58/52/7782 4.1  3.5 - 5.0 g/dL Final  . AST 42/35/3614 32  15 - 41 U/L Final  . ALT 08/25/2016 34  14 - 54 U/L Final  . Alkaline Phosphatase 08/25/2016 102  38 - 126 U/L Final  . Total Bilirubin 08/25/2016 0.4  0.3 - 1.2 mg/dL Final  . GFR calc non Af Amer 08/25/2016 >60  >60 mL/min Final  . GFR calc Af Amer 08/25/2016 >60  >60 mL/min Final   Comment: (NOTE) The eGFR has  been calculated using the CKD EPI equation. This calculation has not been validated in all clinical situations. eGFR's persistently <60 mL/min signify possible Chronic Kidney Disease.   . Anion gap 08/25/2016 8  5 - 15 Final  . WBC 08/25/2016 6.9  3.6 - 11.0 K/uL Final  . RBC 08/25/2016 3.97  3.80 - 5.20 MIL/uL Final  . Hemoglobin 08/25/2016 12.0  12.0 - 16.0 g/dL Final  . HCT 43/15/4008 35.1  35.0 - 47.0 % Final  . MCV 08/25/2016 88.5  80.0 - 100.0 fL Final  . MCH 08/25/2016 30.3  26.0 - 34.0 pg Final  . MCHC 08/25/2016 34.2  32.0 - 36.0 g/dL Final  . RDW  08/25/2016 13.3  11.5 - 14.5 % Final  . Platelets 08/25/2016 397  150 - 440 K/uL Final  . Neutrophils Relative % 08/25/2016 62  % Final  . Neutro Abs 08/25/2016 4.4  1.4 - 6.5 K/uL Final  . Lymphocytes Relative 08/25/2016 24  % Final  . Lymphs Abs 08/25/2016 1.6  1.0 - 3.6 K/uL Final  . Monocytes Relative 08/25/2016 9  % Final  . Monocytes Absolute 08/25/2016 0.6  0.2 - 0.9 K/uL Final  . Eosinophils Relative 08/25/2016 4  % Final  . Eosinophils Absolute 08/25/2016 0.2  0 - 0.7 K/uL Final  . Basophils Relative 08/25/2016 1  % Final  . Basophils Absolute 08/25/2016 0.0  0 - 0.1 K/uL Final    STUDIES: No results found.  ASSESSMENT:  Carcinoma of upper outer quadrant of the left breast, T1a N0M0, stage IA, ER/PR positive, HER-2 negative by FISH.  PLAN:    1. Carcinoma of upper outer quadrant of the left breast, T1a N0M0, stage IA, ER/PR positive, HER-2 negative by FISH: Patient had a partial left mastectomy in October 2014. Given the stage of her disease, she did not require adjuvant chemotherapy. She completed adjuvant XRT. She was initially started on letrozole, but this was discontinued to side effects and patient was initiated on Arimidex in April 2015. Complete 5 years of treatment in approximately April 2020. Patient's most recent mammogram on October 20 13,017 was reported as BI-RADS 2. Repeat in October 2018. Return to clinic  in 6 months for further evaluation. 2. Osteopenia: Patient's most recent bone mineral density on June 08, 2015 for a T score of -1.6. Continue calcium and vitamin D supplementation. Repeat in the next 1-2 weeks.  3. Pulmonary embolism: Patient has completed treatment with Xarelto.   Patient expressed understanding and was in agreement with this plan. She also understands that She can call clinic at any time with any questions, concerns, or complaints.   Breast CA [facility])   Staging form: Breast, AJCC 7th Edition     Clinical stage from 06/25/2013: Stage IA (T1a, N0, M0) - Signed by Evlyn Kanner, NP on 02/02/2016   Lloyd Huger, MD   08/28/2016 9:03 AM

## 2016-08-25 ENCOUNTER — Inpatient Hospital Stay (HOSPITAL_BASED_OUTPATIENT_CLINIC_OR_DEPARTMENT_OTHER): Payer: Medicare Other | Admitting: Oncology

## 2016-08-25 ENCOUNTER — Inpatient Hospital Stay: Payer: Medicare Other | Attending: Oncology

## 2016-08-25 VITALS — BP 118/85 | HR 65 | Temp 97.7°F | Resp 18 | Wt 241.7 lb

## 2016-08-25 DIAGNOSIS — M549 Dorsalgia, unspecified: Secondary | ICD-10-CM | POA: Insufficient documentation

## 2016-08-25 DIAGNOSIS — Z7982 Long term (current) use of aspirin: Secondary | ICD-10-CM | POA: Insufficient documentation

## 2016-08-25 DIAGNOSIS — M8580 Other specified disorders of bone density and structure, unspecified site: Secondary | ICD-10-CM | POA: Insufficient documentation

## 2016-08-25 DIAGNOSIS — I10 Essential (primary) hypertension: Secondary | ICD-10-CM | POA: Insufficient documentation

## 2016-08-25 DIAGNOSIS — C50412 Malignant neoplasm of upper-outer quadrant of left female breast: Secondary | ICD-10-CM

## 2016-08-25 DIAGNOSIS — Z780 Asymptomatic menopausal state: Secondary | ICD-10-CM

## 2016-08-25 DIAGNOSIS — Z79899 Other long term (current) drug therapy: Secondary | ICD-10-CM

## 2016-08-25 DIAGNOSIS — Z923 Personal history of irradiation: Secondary | ICD-10-CM

## 2016-08-25 DIAGNOSIS — Z79811 Long term (current) use of aromatase inhibitors: Secondary | ICD-10-CM | POA: Diagnosis not present

## 2016-08-25 DIAGNOSIS — Z170 Estrogen receptor positive status [ER+]: Secondary | ICD-10-CM

## 2016-08-25 DIAGNOSIS — Z86711 Personal history of pulmonary embolism: Secondary | ICD-10-CM | POA: Diagnosis not present

## 2016-08-25 DIAGNOSIS — Z90710 Acquired absence of both cervix and uterus: Secondary | ICD-10-CM

## 2016-08-25 LAB — COMPREHENSIVE METABOLIC PANEL
ALBUMIN: 4.1 g/dL (ref 3.5–5.0)
ALT: 34 U/L (ref 14–54)
AST: 32 U/L (ref 15–41)
Alkaline Phosphatase: 102 U/L (ref 38–126)
Anion gap: 8 (ref 5–15)
BUN: 23 mg/dL — AB (ref 6–20)
CHLORIDE: 105 mmol/L (ref 101–111)
CO2: 24 mmol/L (ref 22–32)
Calcium: 9.1 mg/dL (ref 8.9–10.3)
Creatinine, Ser: 0.91 mg/dL (ref 0.44–1.00)
GFR calc Af Amer: >60 mL/min (ref >60)
GFR calc non Af Amer: >60 mL/min (ref >60)
GLUCOSE: 99 mg/dL (ref 65–99)
POTASSIUM: 4.4 mmol/L (ref 3.5–5.1)
SODIUM: 137 mmol/L (ref 135–145)
Total Bilirubin: 0.4 mg/dL (ref 0.3–1.2)
Total Protein: 7.4 g/dL (ref 6.5–8.1)

## 2016-08-25 LAB — CBC WITH DIFFERENTIAL/PLATELET
Basophils Absolute: 0 10*3/uL (ref 0–0.1)
Basophils Relative: 1 %
EOS PCT: 4 %
Eosinophils Absolute: 0.2 10*3/uL (ref 0–0.7)
HCT: 35.1 % (ref 35.0–47.0)
Hemoglobin: 12 g/dL (ref 12.0–16.0)
LYMPHS ABS: 1.6 10*3/uL (ref 1.0–3.6)
LYMPHS PCT: 24 %
MCH: 30.3 pg (ref 26.0–34.0)
MCHC: 34.2 g/dL (ref 32.0–36.0)
MCV: 88.5 fL (ref 80.0–100.0)
MONO ABS: 0.6 10*3/uL (ref 0.2–0.9)
Monocytes Relative: 9 %
Neutro Abs: 4.4 10*3/uL (ref 1.4–6.5)
Neutrophils Relative %: 62 %
PLATELETS: 397 10*3/uL (ref 150–440)
RBC: 3.97 MIL/uL (ref 3.80–5.20)
RDW: 13.3 % (ref 11.5–14.5)
WBC: 6.9 10*3/uL (ref 3.6–11.0)

## 2016-09-01 ENCOUNTER — Other Ambulatory Visit: Payer: Self-pay | Admitting: *Deleted

## 2016-09-01 MED ORDER — ANASTROZOLE 1 MG PO TABS: 1.0000 mg | ORAL_TABLET | Freq: Every day | ORAL | 1 refills | Status: DC

## 2016-10-11 ENCOUNTER — Ambulatory Visit
Admission: RE | Admit: 2016-10-11 | Discharge: 2016-10-11 | Disposition: A | Discharge status: Home / Self Care | Payer: Medicare Other | Source: Ambulatory Visit | Attending: Oncology | Admitting: Oncology

## 2016-10-11 DIAGNOSIS — C50412 Malignant neoplasm of upper-outer quadrant of left female breast: Secondary | ICD-10-CM

## 2016-10-11 DIAGNOSIS — M8580 Other specified disorders of bone density and structure, unspecified site: Secondary | ICD-10-CM

## 2016-10-11 DIAGNOSIS — Z780 Asymptomatic menopausal state: Secondary | ICD-10-CM | POA: Diagnosis present

## 2016-10-11 DIAGNOSIS — M85852 Other specified disorders of bone density and structure, left thigh: Secondary | ICD-10-CM | POA: Diagnosis not present

## 2016-11-14 IMAGING — US US EXTREM LOW VENOUS BILAT
1 series · 14 of 24 positions shown · non-contrast
Comparison: 01/19/2009

CLINICAL DATA: Pulmonary embolus, shortness of Breath. Varicose
veins. History breast carcinoma. On Xarelto.

EXAM:
BILATERAL LOWER EXTREMITY VENOUS DOPPLER ULTRASOUND
TECHNIQUE: Gray-scale sonography with compression, as well as color and duplex
ultrasound, were performed to evaluate the deep venous system from
the level of the common femoral vein through the popliteal and
proximal calf veins.

[Series 2: us extrem low venous bilat · 0.07mm/px · 14 of 41 slices shown]
[im 1/41]
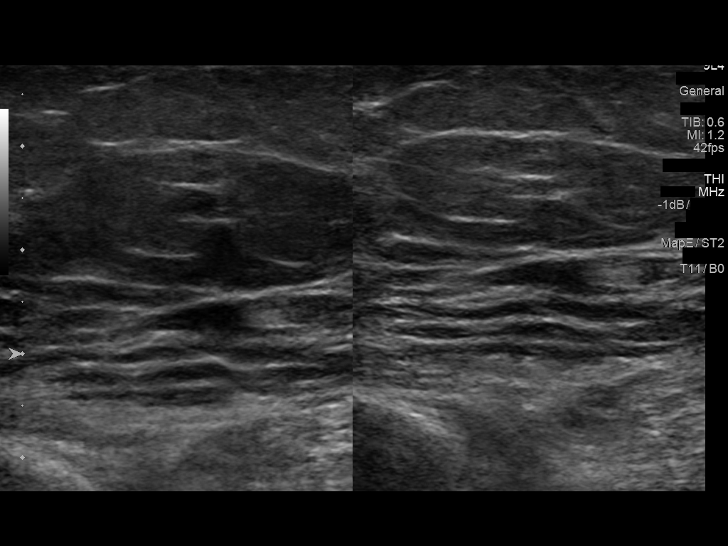
[im 4/41]
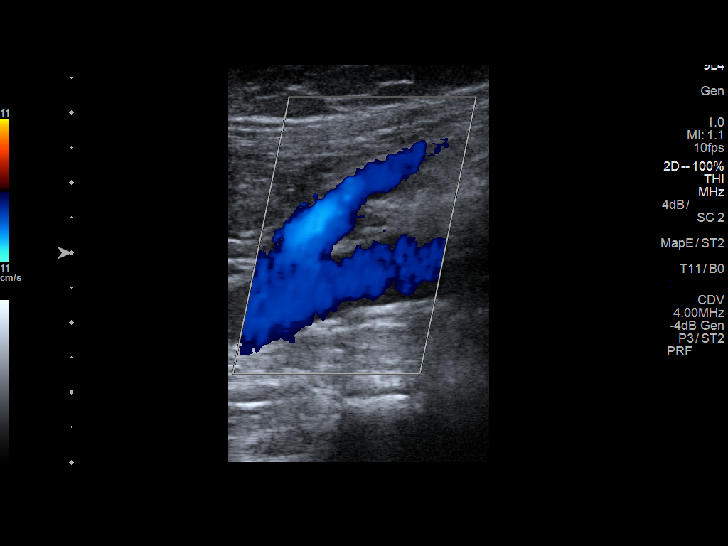
[im 7/41]
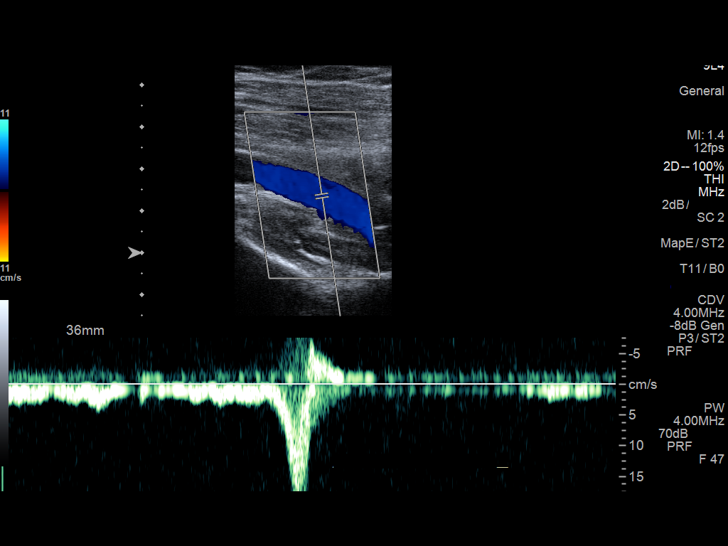
[im 11/41]
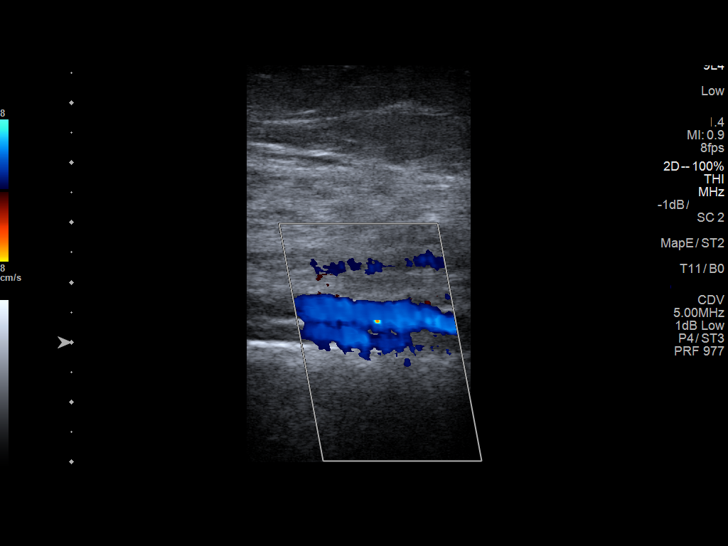
[im 13/41]
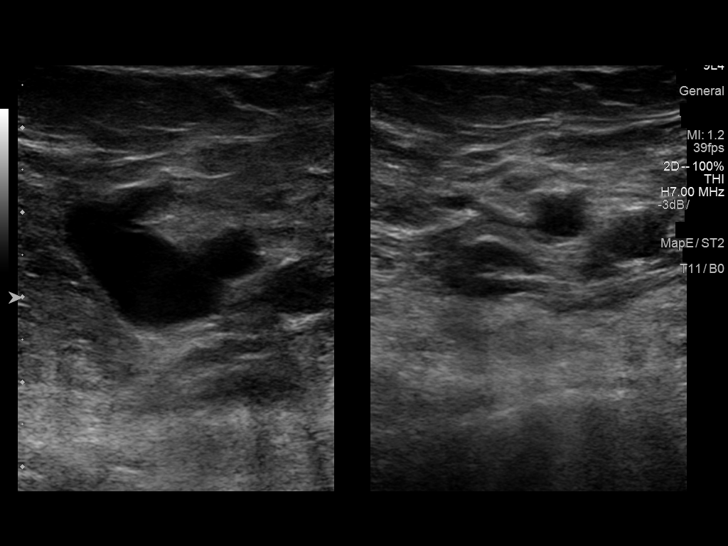
[im 16/41]
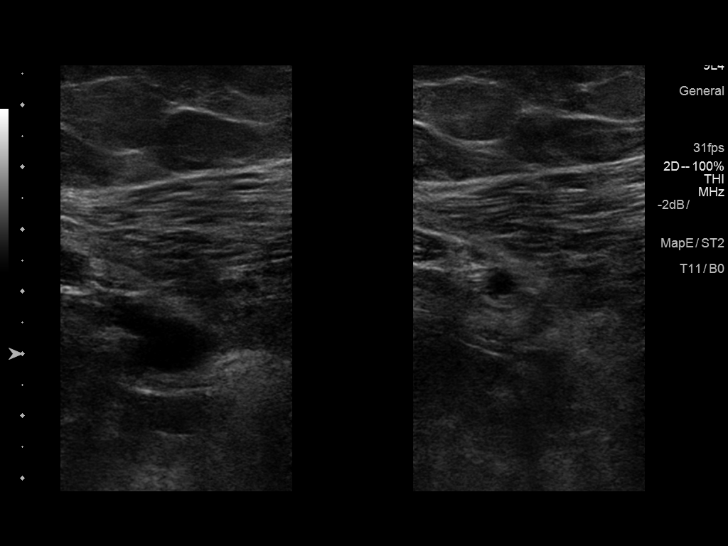
[im 20/41]
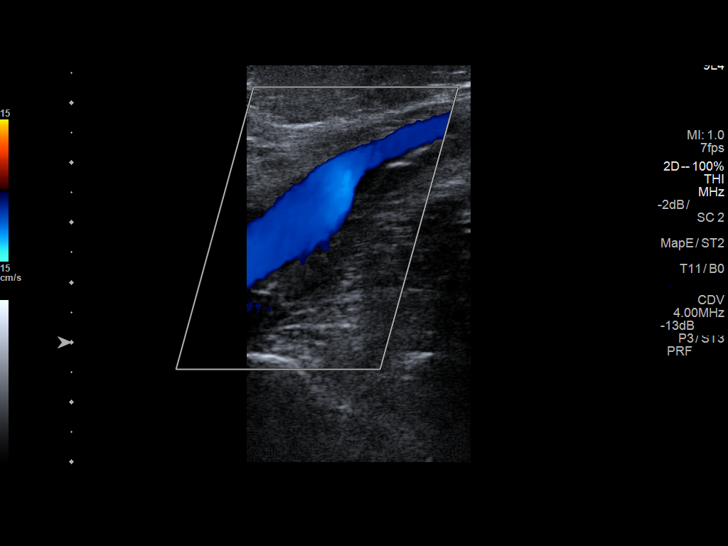
[im 21/41]
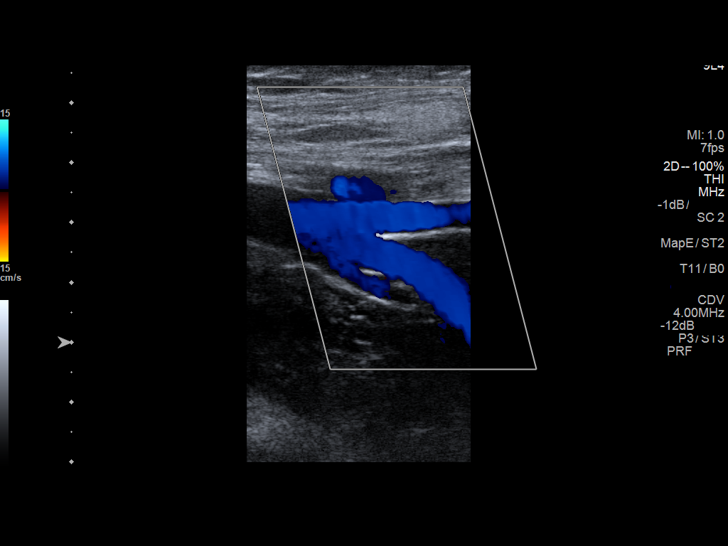
[im 25/41]
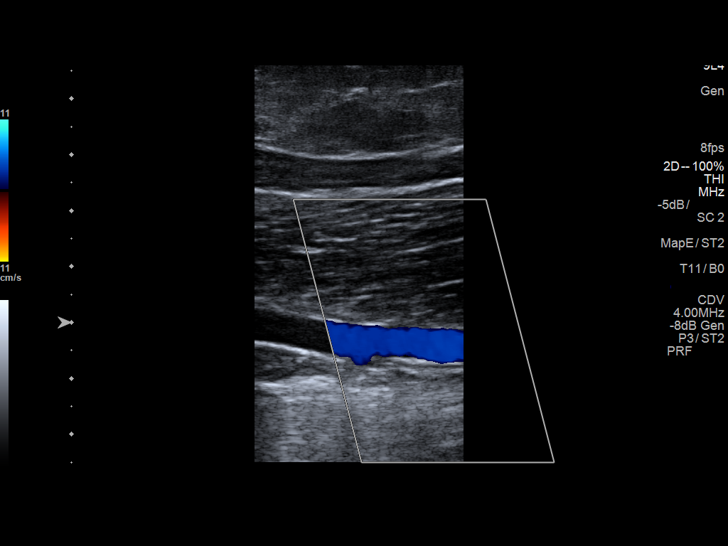
[im 28/41]
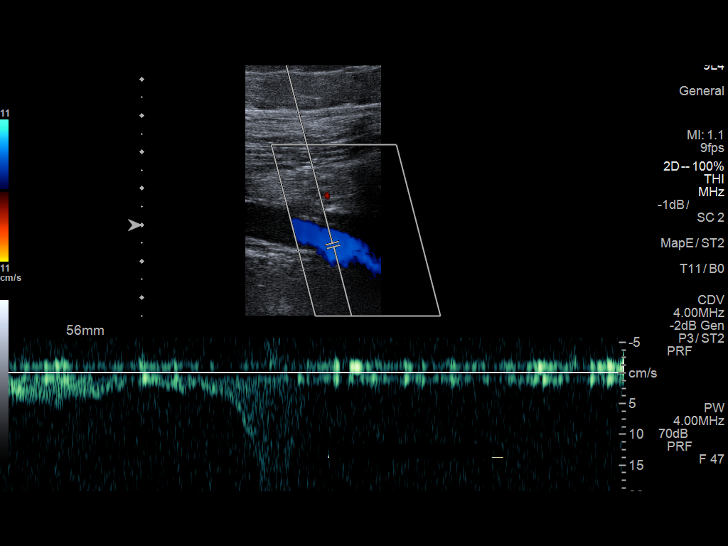
[im 32/41]
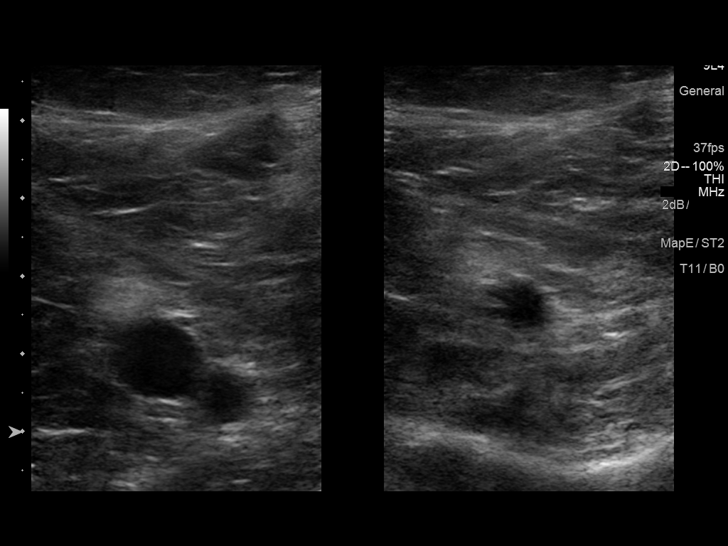
[im 34/41]
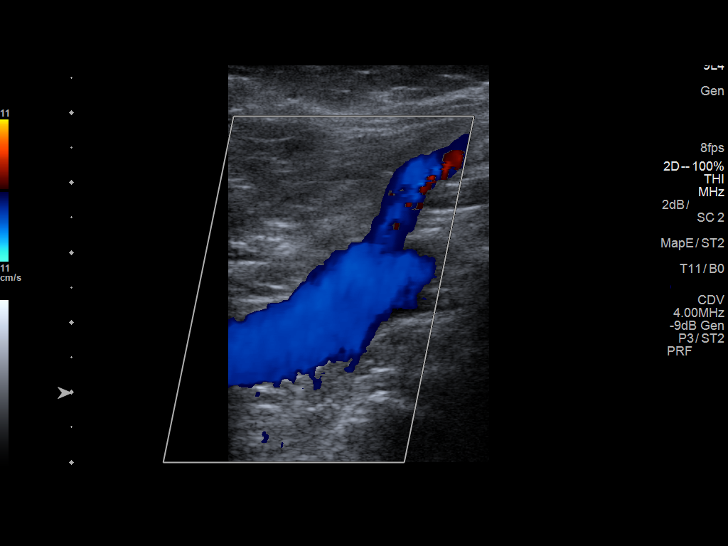
[im 37/41]
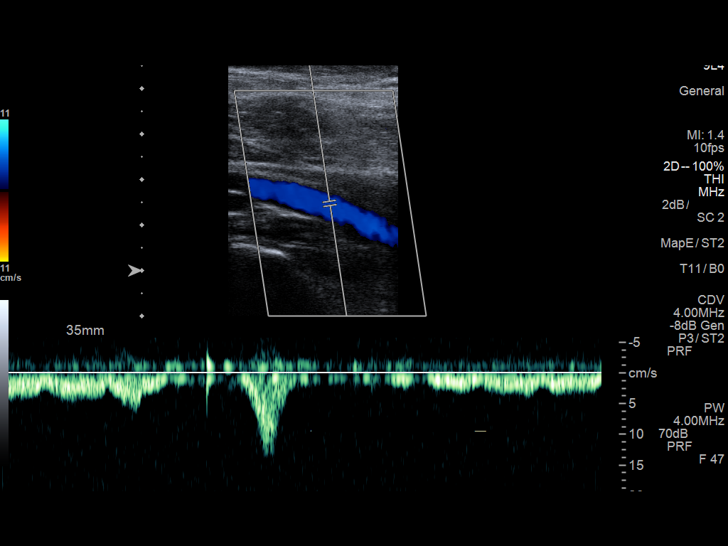
[im 41/41]
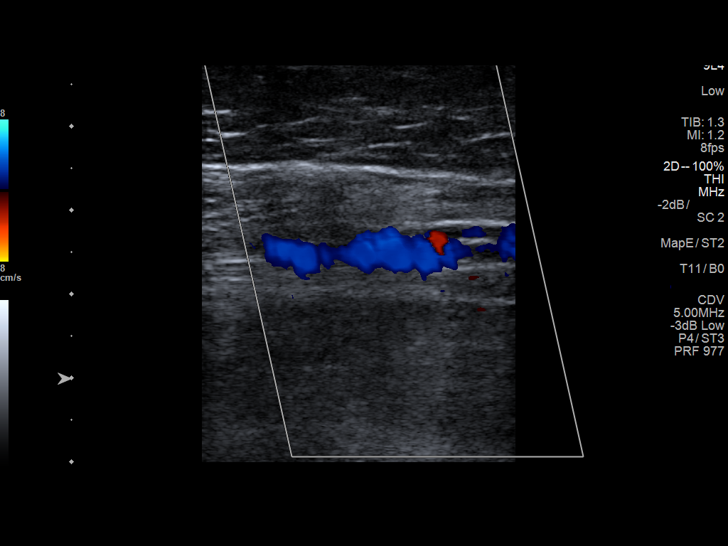

[14 of 24 positions shown; findings below may reference images not displayed]

FINDINGS: Normal compressibility of the common femoral, superficial femoral,
and popliteal veins, as well as the proximal calf veins. No filling
defects to suggest DVT on grayscale or color Doppler imaging.
Doppler waveforms show normal direction of venous flow, normal
respiratory phasicity and response to augmentation. Visualized
segments of the saphenous venous system normal in caliber and
compressibility.
IMPRESSION: 1. No evidence of lower extremity deep vein thrombosis, BILATERALLY.

## 2016-11-23 ENCOUNTER — Encounter: Payer: Self-pay | Admitting: Oncology

## 2016-11-23 MED ORDER — ANASTROZOLE 1 MG PO TABS: 1.0000 mg | ORAL_TABLET | Freq: Every day | ORAL | 1 refills | Status: DC

## 2017-01-06 ENCOUNTER — Emergency Department
Admission: EM | Admit: 2017-01-06 | Discharge: 2017-01-06 | Disposition: A | Discharge status: Home / Self Care | Payer: Medicare Other | Attending: Emergency Medicine | Admitting: Emergency Medicine

## 2017-01-06 ENCOUNTER — Other Ambulatory Visit: Payer: Self-pay | Admitting: Physician Assistant

## 2017-01-06 ENCOUNTER — Encounter: Payer: Self-pay | Admitting: Emergency Medicine

## 2017-01-06 ENCOUNTER — Ambulatory Visit
Admission: RE | Admit: 2017-01-06 | Discharge: 2017-01-06 | Disposition: A | Discharge status: Home / Self Care | Payer: Medicare Other | Source: Ambulatory Visit | Attending: Physician Assistant | Admitting: Physician Assistant

## 2017-01-06 ENCOUNTER — Other Ambulatory Visit: Payer: Self-pay

## 2017-01-06 DIAGNOSIS — Z7982 Long term (current) use of aspirin: Secondary | ICD-10-CM | POA: Insufficient documentation

## 2017-01-06 DIAGNOSIS — I700 Atherosclerosis of aorta: Secondary | ICD-10-CM

## 2017-01-06 DIAGNOSIS — R0602 Shortness of breath: Secondary | ICD-10-CM | POA: Diagnosis present

## 2017-01-06 DIAGNOSIS — R0609 Other forms of dyspnea: Secondary | ICD-10-CM | POA: Insufficient documentation

## 2017-01-06 DIAGNOSIS — I2699 Other pulmonary embolism without acute cor pulmonale: Secondary | ICD-10-CM | POA: Insufficient documentation

## 2017-01-06 DIAGNOSIS — Z79899 Other long term (current) drug therapy: Secondary | ICD-10-CM | POA: Diagnosis not present

## 2017-01-06 DIAGNOSIS — Z853 Personal history of malignant neoplasm of breast: Secondary | ICD-10-CM | POA: Insufficient documentation

## 2017-01-06 DIAGNOSIS — I10 Essential (primary) hypertension: Secondary | ICD-10-CM | POA: Diagnosis not present

## 2017-01-06 LAB — BASIC METABOLIC PANEL
Anion gap: 10 (ref 5–15)
BUN: 23 mg/dL — AB (ref 6–20)
CALCIUM: 10 mg/dL (ref 8.9–10.3)
CO2: 26 mmol/L (ref 22–32)
Chloride: 103 mmol/L (ref 101–111)
Creatinine, Ser: 1.25 mg/dL — ABNORMAL HIGH (ref 0.44–1.00)
GFR calc Af Amer: 50 mL/min — ABNORMAL LOW (ref >60)
GFR calc non Af Amer: 43 mL/min — ABNORMAL LOW (ref >60)
GLUCOSE: 86 mg/dL (ref 65–99)
Potassium: 4.5 mmol/L (ref 3.5–5.1)
Sodium: 139 mmol/L (ref 135–145)

## 2017-01-06 LAB — CBC WITH DIFFERENTIAL/PLATELET
Basophils Absolute: 0.1 10*3/uL (ref 0–0.1)
Basophils Relative: 1 %
Eosinophils Absolute: 0.3 10*3/uL (ref 0–0.7)
Eosinophils Relative: 4 %
HCT: 37.1 % (ref 35.0–47.0)
Hemoglobin: 12.4 g/dL (ref 12.0–16.0)
LYMPHS ABS: 1.7 10*3/uL (ref 1.0–3.6)
LYMPHS PCT: 23 %
MCH: 27.3 pg (ref 26.0–34.0)
MCHC: 33.3 g/dL (ref 32.0–36.0)
MCV: 82.1 fL (ref 80.0–100.0)
MONO ABS: 0.6 10*3/uL (ref 0.2–0.9)
MONOS PCT: 8 %
Neutro Abs: 4.8 10*3/uL (ref 1.4–6.5)
Neutrophils Relative %: 64 %
Platelets: 473 10*3/uL — ABNORMAL HIGH (ref 150–440)
RBC: 4.52 MIL/uL (ref 3.80–5.20)
RDW: 16 % — AB (ref 11.5–14.5)
WBC: 7.6 10*3/uL (ref 3.6–11.0)

## 2017-01-06 LAB — TROPONIN I: Troponin I: <0.03 ng/mL (ref <0.03)

## 2017-01-06 LAB — TYPE AND SCREEN
ABO/RH(D): A POS
Antibody Screen: NEGATIVE

## 2017-01-06 LAB — PROTIME-INR
INR: 0.99
Prothrombin Time: 13.1 seconds (ref 11.4–15.2)

## 2017-01-06 LAB — APTT: aPTT: 28 seconds (ref 24–36)

## 2017-01-06 MED ORDER — RIVAROXABAN 15 MG PO TABS
15.0000 mg | ORAL_TABLET | Freq: Once | ORAL | Status: AC
  Administered 2017-01-06: 15 mg via ORAL
  Filled 2017-01-06: qty 1

## 2017-01-06 MED ORDER — IOPAMIDOL (ISOVUE-370) INJECTION 76%
75.0000 mL | Freq: Once | INTRAVENOUS | Status: AC | PRN
  Administered 2017-01-06: 75 mL via INTRAVENOUS

## 2017-01-06 MED ORDER — RIVAROXABAN (XARELTO) VTE STARTER PACK (15 & 20 MG): ORAL_TABLET | ORAL | 0 refills | Status: DC

## 2017-01-06 NOTE — ED Provider Notes (Signed)
[facility] Emergency Department Provider Note  ____________________________________________   I have reviewed the triage vital signs and the nursing notes.   HISTORY  Chief Complaint Shortness of Breath    HPI Beth Hoover is a 71 y.o. female with a history of blood clots, bilaterally, after breast cancer. She states that she's been off Xarelto 46 months. She was on at [address] prior to that. She denies any chest pain however she states the last 3 weeks or so she's been having decreased energy and been feeling somewhat short of breath. An outpatient CT scan was obtained which shows bilateral PEs with no evidence of heart strain. Sats are 100% and she is in no acute distress. She would prefer to go home possible.       Past Medical History:  Diagnosis Date  . Breast cancer ([place]) 2014   left breast with rad tx  . Cancer [facility]) 2014   Left Breast CA with Lumpectomy and 36 Rad tx's.  Marland Kitchen Hypertension     Patient Active Problem List   Diagnosis Date Noted  . PE (pulmonary embolism) 02/02/2016  . Absolute anemia 05/18/2015  . Arthritis 05/18/2015  . Clinical depression 05/18/2015  . Allergy to environmental factors 05/18/2015  . 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 Adiposity 05/18/2015  . BP (high blood pressure) 05/18/2015  . Primary cancer of upper outer quadrant of left female breast ([place]) 02/07/2014  . Blood glucose elevated 02/07/2014  . Obstructive apnea 02/07/2014  . Derangement of medial meniscus, posterior horn 11/15/2013  . Benign essential HTN 11/15/2013  . History of artificial joint 11/15/2013    Past Surgical History:  Procedure Laterality Date  . BREAST BIOPSY Left 06/06/2013   positive    Prior to Admission medications   Medication Sig Start Date End Date Taking? Authorizing Provider  acetaminophen (TYLENOL) 500 MG tablet Take 500 mg by mouth every 6 (six) hours as needed.    Historical Provider, MD  anastrozole (ARIMIDEX) 1 MG tablet  Take 1 tablet (1 mg total) by mouth daily. 11/23/16   Lloyd Huger, MD  aspirin 325 MG tablet Take 325 mg by mouth daily.    Historical Provider, MD  atorvastatin (LIPITOR) 40 MG tablet Take 40 mg by mouth daily at 6 PM.  11/18/14   Historical Provider, MD  hydrochlorothiazide (HYDRODIURIL) 25 MG tablet Take 25 mg by mouth as needed.     Historical Provider, MD  Melatonin 5 MG CAPS Take by mouth.    Historical Provider, MD  MELOXICAM PO Take 1 tablet by mouth daily.    Historical Provider, MD  metoprolol (LOPRESSOR) 50 MG tablet Take 50 mg by mouth daily.  11/18/14   Historical Provider, MD  Multiple Vitamin (MULTI-VITAMINS) TABS Take 1 tablet by mouth daily.     Historical Provider, MD  pantoprazole (PROTONIX) 40 MG tablet Take 40 mg by mouth daily.  11/18/14   Historical Provider, MD  traMADol (ULTRAM) 50 MG tablet Take by mouth every 6 (six) hours as needed.    Historical Provider, MD  venlafaxine XR (EFFEXOR-XR) 150 MG 24 hr capsule Take 150 mg by mouth daily with breakfast.  11/18/14   Historical Provider, MD    Allergies Actifed cold-allergy [chlorpheniramine-phenylephrine]; Septra [sulfamethoxazole-trimethoprim]; and Sulfa antibiotics  Family History  Problem Relation Age of Onset  . Breast cancer Maternal Aunt     60's    Social History Social History  Substance Use Topics  . Smoking status: Never Smoker  .  Smokeless tobacco: Not on file  . Alcohol use Not on file    Review of Systems Constitutional: No fever/chills Eyes: No visual changes. ENT: No sore throat. No stiff neck no neck pain Cardiovascular: Denies chest pain. Respiratory: See history of present illness regarding shortness of breath. Gastrointestinal:   no vomiting.  No diarrhea.  No constipation. Genitourinary: Negative for dysuria. Musculoskeletal: Negative lower extremity swelling Skin: Negative for rash. Neurological: Negative for severe headaches, focal weakness or numbness. 10-point ROS otherwise  negative.  ____________________________________________   PHYSICAL EXAM:  VITAL SIGNS: ED Triage Vitals  Enc Vitals Group     BP 01/06/17 1339 132/75     Pulse Rate 01/06/17 1339 75     Resp 01/06/17 1339 18     Temp 01/06/17 1339 97.9 F (36.6 C)     Temp Source 01/06/17 1339 Oral     SpO2 01/06/17 1339 98 %     Weight 01/06/17 1337 240 lb (108.9 kg)     Height 01/06/17 1337 5\' 4"  (1.626 m)     Head Circumference --      Peak Flow --      Pain Score --      Pain Loc --      Pain Edu? --      Excl. in [place]? --     Constitutional: Alert and oriented. Well appearing and in no acute distress. Eyes: Conjunctivae are normal. PERRL. EOMI. Head: Atraumatic. Nose: No congestion/rhinnorhea. Mouth/Throat: Mucous membranes are moist.  Oropharynx non-erythematous. Neck: No stridor.   Nontender with no meningismus Cardiovascular: Normal rate, regular rhythm. Grossly normal heart sounds.  Good peripheral circulation. Respiratory: Normal respiratory effort.  No retractions. Lungs CTAB. Abdominal: Soft and nontender. No distention. No guarding no rebound Back:  There is no focal tenderness or step off.  there is no midline tenderness there are no lesions noted. there is no CVA tenderness Musculoskeletal: No lower extremity tenderness, no upper extremity tenderness. No joint effusions, no DVT signs strong distal pulses no edema Neurologic:  Normal speech and language. No gross focal neurologic deficits are appreciated.  Skin:  Skin is warm, dry and intact. No rash noted. Psychiatric: Mood and affect are normal. Speech and behavior are normal.  ____________________________________________   LABS (all labs ordered are listed, but only abnormal results are displayed)  Labs Reviewed  CBC WITH DIFFERENTIAL/PLATELET - Abnormal; Notable for the following:       Result Value   RDW 16.0 (*)    Platelets 473 (*)    All other components within normal limits  BASIC METABOLIC PANEL - Abnormal;  Notable for the following:    BUN 23 (*)    Creatinine, Ser 1.25 (*)    GFR calc non Af Amer 43 (*)    GFR calc Af Amer 50 (*)    All other components within normal limits  TROPONIN I  APTT  PROTIME-INR  TYPE AND SCREEN   ____________________________________________  EKG  I personally interpreted any EKGs ordered by me or triage Sinus rhythm rate 69 bpm no acute ST elevation or acute ST depression no evidence of heart strain.  RADIOLOGY  I reviewed any imaging ordered by me or triage that were performed during my shift and, if possible, patient and/or family made aware of any abnormal findings. ____________________________________________   PROCEDURES  Procedure(s) performed: None  Procedures  Critical Care performed: None  ____________________________________________   INITIAL IMPRESSION / ASSESSMENT AND PLAN / ED COURSE  Pertinent labs &  imaging results that were available during my care of the patient were reviewed by me and considered in my medical decision making (see chart for details).  Administration with PEs on outpatient CT scan. This is a recurrent event for her. I discussed with Dr. Doy Hutching her primary care doctor. He states that they did not need to send her into the emergency department. They feel that given that she has had symptoms for several weeks, she is stable for outpatient anticoagulation. I agree. They did want me to refer her to Dr. dew for further assessment which I can do. The patient is in no acute distress. She herself would very much prefer to go home. We'll start her on Xarelto. Blood work is reassuring. Her troponin is negative EKG is reassuring, oxygen saturations 100% on room air, extensive return precautions and follow-up given and understood, I gave the patient and extensive discussion of the risks benefits and alternative to anticoagulation, she does not have any history of GI bleeding she never has had a head bleed, she does understand the  need to be very careful taking these medications.    ____________________________________________   FINAL CLINICAL IMPRESSION(S) / ED DIAGNOSES  Final diagnoses:  None      This chart was dictated using voice recognition software.  Despite best efforts to proofread,  errors can occur which can change meaning.      Schuyler Amor, MD 01/06/17 1535

## 2017-01-06 NOTE — ED Triage Notes (Signed)
Pt went to PCP for [facility] and fatigue for last few weeks. Unlabored at this time. Had CT done, showing bilateral PE. Denies pain currently.

## 2017-01-06 NOTE — ED Notes (Signed)
Two unsuccessful IV attempts per this RN. 

## 2017-01-10 ENCOUNTER — Telehealth: Payer: Self-pay | Admitting: *Deleted

## 2017-01-10 NOTE — Telephone Encounter (Signed)
-----   Message from Sindy Guadeloupe, MD sent at 01/10/2017 11:29 AM EDT ----- Pt on arimidex with second PE. Wants to know if arimidex can do it. There is 2-4% risk. She should stop arimidex at this point and not take hormone therapy until I see her in June 2018

## 2017-01-10 NOTE — Telephone Encounter (Signed)
Called concerned because she has been diagnosed with her second PE this past Friday and she had one in March of 2017. Asking if the Anastrozole could be partially the cause of PE. Will see Dr Janese Banks for first time in June. Old Dr Oliva Bustard patient. Please advise.  IMPRESSION: 1. Bilateral pulmonary emboli including distal main and lobar emboli. 2. No definite evidence of acute right heart strain. Unchanged, mildly abnormal RV/LV ratio. 3.  Aortic Atherosclerosis (ICD10-I70.0).  Critical Value/emergent results were called by telephone at the time of interpretation on 01/06/2017 at 1:16 pm to Dr. Sabra Heck, who verbally acknowledged these results.   Electronically Signed   By: Logan Bores M.D.   On: 01/06/2017 13:16

## 2017-01-10 NOTE — Telephone Encounter (Signed)
I called pt and let her know I will get answer and call her back. Under the drug itself it states 2-4% chance of PE or DVT.  She just thinks it is odd for her to have 2 in less than 18 months.

## 2017-01-10 NOTE — Telephone Encounter (Signed)
I called back to pt and let her know that I got the answer it was in a another kind of message to me.  I told her that Dr. Janese Banks said to stop armidex and when she sees pt In June then we will see how she is doing at that time. If she has questions she can call me

## 2017-01-11 ENCOUNTER — Other Ambulatory Visit: Payer: Self-pay | Admitting: Internal Medicine

## 2017-01-11 DIAGNOSIS — I2699 Other pulmonary embolism without acute cor pulmonale: Secondary | ICD-10-CM

## 2017-01-11 NOTE — Telephone Encounter (Signed)
Per VO Dr Janese Banks stop Anastrozole and will discuss another AI at her appt in June. Left message for patient to return my call

## 2017-01-17 ENCOUNTER — Ambulatory Visit
Admission: RE | Admit: 2017-01-17 | Discharge: 2017-01-17 | Disposition: A | Discharge status: Home / Self Care | Payer: Medicare Other | Source: Ambulatory Visit | Attending: Internal Medicine | Admitting: Internal Medicine

## 2017-01-17 DIAGNOSIS — I2699 Other pulmonary embolism without acute cor pulmonale: Secondary | ICD-10-CM | POA: Diagnosis not present

## 2017-01-23 ENCOUNTER — Ambulatory Visit (INDEPENDENT_AMBULATORY_CARE_PROVIDER_SITE_OTHER): Payer: Medicare Other | Admitting: Vascular Surgery

## 2017-01-23 ENCOUNTER — Encounter (INDEPENDENT_AMBULATORY_CARE_PROVIDER_SITE_OTHER): Payer: Self-pay | Admitting: Vascular Surgery

## 2017-01-23 VITALS — BP 128/79 | HR 79 | Resp 21 | Ht 64.0 in | Wt 236.0 lb

## 2017-01-23 DIAGNOSIS — I8311 Varicose veins of right lower extremity with inflammation: Secondary | ICD-10-CM | POA: Diagnosis not present

## 2017-01-23 DIAGNOSIS — I2699 Other pulmonary embolism without acute cor pulmonale: Secondary | ICD-10-CM

## 2017-01-23 DIAGNOSIS — I872 Venous insufficiency (chronic) (peripheral): Secondary | ICD-10-CM | POA: Insufficient documentation

## 2017-01-23 DIAGNOSIS — Z86718 Personal history of other venous thrombosis and embolism: Secondary | ICD-10-CM | POA: Insufficient documentation

## 2017-01-23 DIAGNOSIS — I8312 Varicose veins of left lower extremity with inflammation: Secondary | ICD-10-CM | POA: Diagnosis not present

## 2017-01-23 DIAGNOSIS — C50412 Malignant neoplasm of upper-outer quadrant of left female breast: Secondary | ICD-10-CM

## 2017-01-23 NOTE — Progress Notes (Signed)
MRN : [id number]  Beth Hoover is a 71 y.o. (1945-09-24) female who presents with chief complaint of No chief complaint on file. Marland Kitchen  History of Present Illness:  The patient presents to the office for evaluation of PE.  The PE was identified in April when she presented to the ER wit increasing SOB.  Her SOB continues but is better.   Past DVT was identified at [facility] by Duplex ultrasound.  The initial symptoms were pain and swelling in the lower extremity.  The patient notes her legs are a bit painful with dependency and swells some.  Symptoms are much better with elevation.  The patient notes minimal edema in the morning which steadily worsens throughout the day.    The patient has been using compression therapy at this point.  No new SOB or pleuritic chest pains since April.  No cough or hemoptysis.  No blood per rectum or blood in any sputum.  No excessive bruising per the patient.       Current Meds  Medication Sig  . acetaminophen (TYLENOL) 500 MG tablet Take 500 mg by mouth every 6 (six) hours as needed.  Marland Kitchen atorvastatin (LIPITOR) 40 MG tablet Take 40 mg by mouth daily at 6 PM.   . hydrochlorothiazide (HYDRODIURIL) 25 MG tablet Take 25 mg by mouth as needed.   . Melatonin 5 MG CAPS Take by mouth.  . metoprolol (LOPRESSOR) 50 MG tablet Take 50 mg by mouth daily.   . Multiple Vitamin (MULTI-VITAMINS) TABS Take 1 tablet by mouth daily.   . pantoprazole (PROTONIX) 40 MG tablet Take 40 mg by mouth daily.   . Rivaroxaban 15 & 20 MG TBPK Take as directed on package: Start with one 15mg  tablet by mouth twice a day with food. On Day 22, switch to one 20mg  tablet once a day with food.  . traMADol (ULTRAM) 50 MG tablet Take by mouth every 6 (six) hours as needed.  . venlafaxine XR (EFFEXOR-XR) 150 MG 24 hr capsule Take 150 mg by mouth daily with breakfast.     Past Medical History:  Diagnosis Date  . Breast cancer ([place]) 2014   left breast with rad tx  . Cancer [facility]) 2014   Left  Breast CA with Lumpectomy and 36 Rad tx's.  Marland Kitchen Hypertension     Past Surgical History:  Procedure Laterality Date  . BREAST BIOPSY Left 06/06/2013   positive    Social History Social History  Substance Use Topics  . Smoking status: Never Smoker  . Smokeless tobacco: Never Used  . Alcohol use Not on file    Family History Family History  Problem Relation Age of Onset  . Breast cancer Maternal Aunt        60's  . Cancer Maternal Aunt   . Mitral valve prolapse Mother   . Cancer Father   . AAA (abdominal aortic aneurysm) Father   . Rheumatic fever Sister     Allergies  Allergen Reactions  . Actifed Cold-Allergy [Chlorpheniramine-Phenylephrine] Palpitations  . Septra [Sulfamethoxazole-Trimethoprim] Rash  . Sulfa Antibiotics Rash     REVIEW OF SYSTEMS (Negative unless checked)  Constitutional: [] Weight loss  [] Fever  [] Chills Cardiac: [] Chest pain   [] Chest pressure   [] Palpitations   [] Shortness of breath when laying flat   [x] Shortness of breath with exertion. Vascular:  [] Pain in legs with walking   [] Pain in legs at rest  [] History of DVT   [] Phlebitis   [x] Swelling in legs   [  x]Varicose veins   [] Non-healing ulcers Pulmonary:   [] Uses home oxygen   [] Productive cough   [] Hemoptysis   [] Wheeze  [] COPD   [] Asthma Neurologic:  [] Dizziness   [] Seizures   [] History of stroke   [] History of TIA  [] Aphasia   [] Vissual changes   [] Weakness or numbness in arm   [] Weakness or numbness in leg Musculoskeletal:   [] Joint swelling   [] Joint pain   [] Low back pain Hematologic:  [x] Easy bruising  [] Easy bleeding   [x] Hypercoagulable state   [] Anemic Gastrointestinal:  [] Diarrhea   [] Vomiting  [] Gastroesophageal reflux/heartburn   [] Difficulty swallowing. Genitourinary:  [] Chronic kidney disease   [] Difficult urination  [] Frequent urination   [] Blood in urine Skin:  [] Rashes   [] Ulcers  Psychological:  [] History of anxiety   []  History of major depression.  Physical  Examination  Vitals:   01/23/17 1321  BP: 128/79  Pulse: 79  Resp: (!) 21  Weight: 236 lb (107 kg)  Height: 5\' 4"  (1.626 m)   Body mass index is 40.51 kg/m. Gen: WD/WN, NAD Head: [place]/AT, No temporalis wasting.  Ear/Nose/Throat: Hearing grossly intact, nares w/o erythema or drainage Eyes: PER, EOMI, sclera nonicteric.  Neck: Supple, no large masses.   Pulmonary:  Good air movement, no audible wheezing bilaterally, no use of accessory muscles.  Cardiac: RRR, no JVD Vascular: scattered varicose veins bilaterally 1-2+ edema of the legs bilaterally Vessel Right Left  Radial Palpable Palpable  Gastrointestinal: Non-distended. No guarding/no peritoneal signs.  Musculoskeletal: M/S 5/5 throughout.  No deformity or atrophy.  Neurologic: CN 2-12 intact. Symmetrical.  Speech is fluent. Motor exam as listed above. Psychiatric: Judgment intact, Mood & affect appropriate for pt's clinical situation. Dermatologic: No rashes or ulcers noted.  No changes consistent with cellulitis. Lymph : No lichenification or skin changes of chronic lymphedema.  CBC Lab Results  Component Value Date   WBC 7.6 01/06/2017   HGB 12.4 01/06/2017   HCT 37.1 01/06/2017   MCV 82.1 01/06/2017   PLT 473 (H) 01/06/2017    BMET    Component Value Date/Time   NA 139 01/06/2017 1350   NA 137 04/22/2014 1005   K 4.5 01/06/2017 1350   K 4.6 04/22/2014 1005   CL 103 01/06/2017 1350   CL 102 04/22/2014 1005   CO2 26 01/06/2017 1350   CO2 24 04/22/2014 1005   GLUCOSE 86 01/06/2017 1350   GLUCOSE 105 (H) 04/22/2014 1005   BUN 23 (H) 01/06/2017 1350   BUN 22 (H) 04/22/2014 1005   CREATININE 1.25 (H) 01/06/2017 1350   CREATININE 1.25 04/22/2014 1005   CALCIUM 10.0 01/06/2017 1350   CALCIUM 9.3 04/22/2014 1005   GFRNONAA 43 (L) 01/06/2017 1350   GFRNONAA 44 (L) 04/22/2014 1005   GFRAA 50 (L) 01/06/2017 1350   GFRAA 52 (L) 04/22/2014 1005   Estimated Creatinine Clearance: 50.7 mL/min (A) (by C-G formula based  on SCr of 1.25 mg/dL (H)).  COAG Lab Results  Component Value Date   INR 0.99 01/06/2017    Radiology Ct Angio Chest Pe W Or Wo Contrast  Result Date: 01/06/2017 CLINICAL DATA:  Shortness of breath for 4 weeks. History of pulmonary embolus. History of breast cancer. EXAM: CT ANGIOGRAPHY CHEST WITH CONTRAST TECHNIQUE: Multidetector CT imaging of the chest was performed using the standard protocol during bolus administration of intravenous contrast. Multiplanar CT image reconstructions and MIPs were obtained to evaluate the vascular anatomy. CONTRAST:  75 mL Isovue 370 COMPARISON:  06/06/2016 FINDINGS: Cardiovascular: Pulmonary  arterial opacification is adequate. Emboli are present in the distal right main pulmonary artery extending into the interlobar artery as well as right upper and right lower lobe lobar segmental branches. There is also segmental embolus in the right middle lobe. On the left, there is embolus near the distal main pulmonary artery with lobar and segmental emboli in the upper and lower lobes. There is flattening of the interventricular septum within RV/LV ratio of 0.94, not significantly changed from the prior CTA. No pericardial effusion. Normal caliber of the thoracic aorta with mild atherosclerosis. Mediastinum/Nodes: No enlarged axillary, mediastinal, or hilar lymph nodes. Moderate-sized sliding hiatal hernia. Lungs/Pleura: No pleural effusion or pneumothorax. Minimal dependent atelectasis in both lower lobes. No mass. Upper Abdomen: No acute abnormality. Musculoskeletal: Diffuse thoracic spine disc degeneration. No suspicious osseous lesion. Review of the MIP images confirms the above findings. IMPRESSION: 1. Bilateral pulmonary emboli including distal main and lobar emboli. 2. No definite evidence of acute right heart strain. Unchanged, mildly abnormal RV/LV ratio. 3.  Aortic Atherosclerosis (ICD10-I70.0). Critical Value/emergent results were called by telephone at the time of  interpretation on 01/06/2017 at 1:16 pm to Dr. Sabra Heck, who verbally acknowledged these results. Electronically Signed   By: Logan Bores M.D.   On: 01/06/2017 13:16   US Venous Img Lower Bilateral  Result Date: 01/17/2017 CLINICAL DATA:  Recent pulmonary embolus. History of previous deep venous thrombosis EXAM: BILATERAL LOWER EXTREMITY VENOUS DUPLEX ULTRASOUND TECHNIQUE: Gray-scale sonography with graded compression, as well as color Doppler and duplex ultrasound were performed to evaluate the lower extremity deep venous systems from the level of the common femoral vein and including the common femoral, femoral, profunda femoral, popliteal and calf veins including the posterior tibial, peroneal and gastrocnemius veins when visible. The superficial great saphenous vein was also interrogated. Spectral Doppler was utilized to evaluate flow at rest and with distal augmentation maneuvers in the common femoral, femoral and popliteal veins. COMPARISON:  November 25, 2015 FINDINGS: RIGHT LOWER EXTREMITY Common Femoral Vein: No evidence of thrombus. Normal compressibility, respiratory phasicity and response to augmentation. Saphenofemoral Junction: No evidence of thrombus. Normal compressibility and flow on color Doppler imaging. Profunda Femoral Vein: No evidence of thrombus. Normal compressibility and flow on color Doppler imaging. Femoral Vein: No evidence of thrombus. Normal compressibility, respiratory phasicity and response to augmentation. Popliteal Vein: No evidence of thrombus. Normal compressibility, respiratory phasicity and response to augmentation. Calf Veins: No evidence of thrombus. Normal compressibility and flow on color Doppler imaging. Superficial Great Saphenous Vein: No evidence of thrombus. Normal compressibility and flow on color Doppler imaging. Venous Reflux:  None. Other Findings:  None. LEFT LOWER EXTREMITY Common Femoral Vein: No evidence of thrombus. Normal compressibility, respiratory phasicity  and response to augmentation. Saphenofemoral Junction: No evidence of thrombus. Normal compressibility and flow on color Doppler imaging. Profunda Femoral Vein: No evidence of thrombus. Normal compressibility and flow on color Doppler imaging. Femoral Vein: No evidence of thrombus. Normal compressibility, respiratory phasicity and response to augmentation. Popliteal Vein: No evidence of thrombus. Normal compressibility, respiratory phasicity and response to augmentation. Calf Veins: No evidence of thrombus. Normal compressibility and flow on color Doppler imaging. Superficial Great Saphenous Vein: No evidence of thrombus. Normal compressibility and flow on color Doppler imaging. Venous Reflux:  None. Other Findings:  None. IMPRESSION: No evidence of deep venous thrombosis in either lower extremity. Electronically Signed   By: Lowella Grip III M.D.   On: 01/17/2017 16:02    Assessment/Plan 1. Recurrent pulmonary embolism ([place]) Recommend:  No surgery or intervention at this point in time.  IVC filter is not indicated at present.  The patient is initiated on anticoagulation she will follow up with Dr Janese Banks at the cancer center regarding duration of anticoagulation  Elevation was stressed, use of a recliner was discussed.  I have had a long discussion with the patient regarding DVT and post phlebitic changes such as swelling and why it  causes symptoms such as pain.  The patient will wear graduated compression stockings class 1 (20-30 mmHg), beginning after three full days of anticoagulation, on a daily basis a prescription was given. The patient will  beginning wearing the stockings first thing in the morning and removing them in the evening. The patient is instructed specifically not to sleep in the stockings.  In addition, behavioral modification including elevation during the day and avoidance of prolonged dependency will be initiated.    The patient will continue anticoagulation for now as there  have not been any problems or complications at this point.    2. History of DVT (deep vein thrombosis) See #1  3. Primary cancer of upper outer quadrant of left female breast [facility]) Continue to follow with Dr Janese Banks  4. Varicose veins of both lower extremities with inflammation No surgery or intervention at this point in time.    I have had a long discussion with the patient regarding venous insufficiency and why it  causes symptoms. I have discussed with the patient the chronic skin changes that accompany venous insufficiency and the long term sequela such as infection and ulceration.  Patient will begin wearing graduated compression stockings class 1 (20-30 mmHg) or compression wraps on a daily basis a prescription was given. The patient will put the stockings on first thing in the morning and removing them in the evening. The patient is instructed specifically not to sleep in the stockings.    In addition, behavioral modification including several periods of elevation of the lower extremities during the day will be continued. I have demonstrated that proper elevation is a position with the ankles at heart level.  The patient is instructed to begin routine exercise, especially walking on a daily basis  Following the review of the ultrasound the patient will follow up PRN   Hortencia Pilar, MD  01/23/2017 1:42 PM

## 2017-02-07 IMAGING — US US RENAL
1 series · 14 of 25 positions shown · non-contrast
Comparison: None.

CLINICAL DATA: Hematuria, burning with urination for 2 weeks.

EXAM:
RENAL / URINARY TRACT ULTRASOUND COMPLETE

[Series 1: us renal · 0.25mm/px · 14 of 29 slices shown]
[im 1/29]
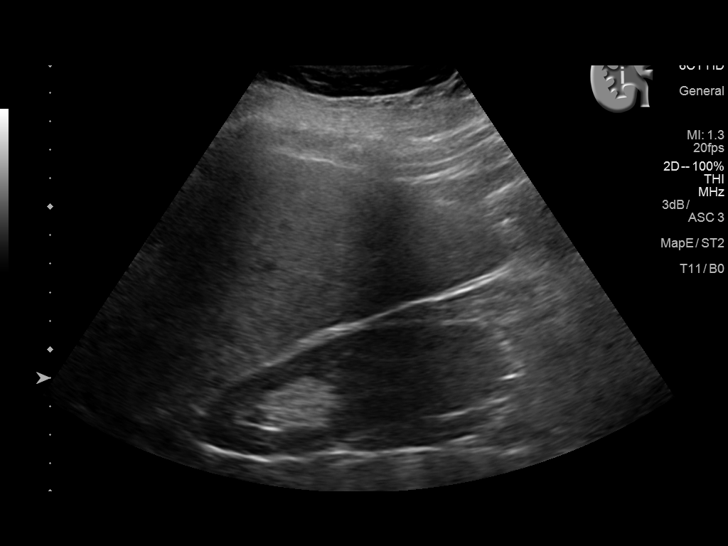
[im 3/29]
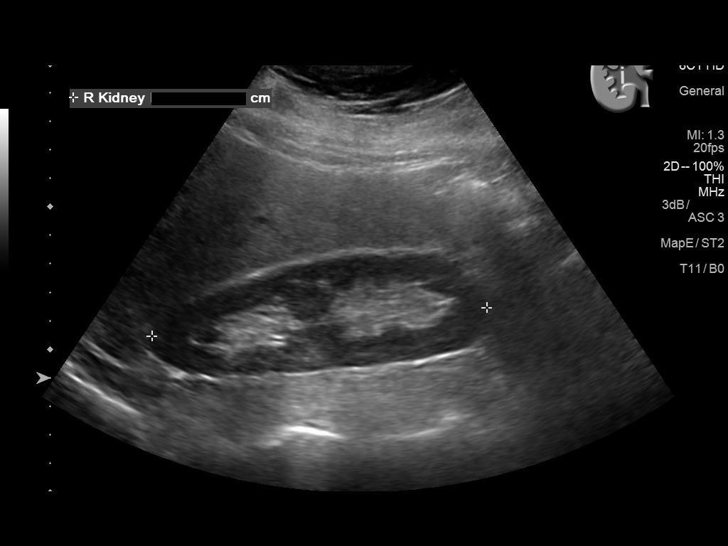
[im 5/29]
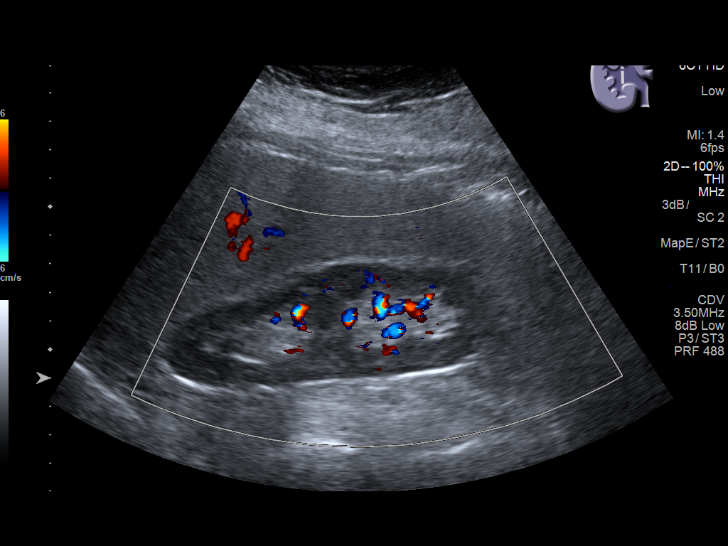
[im 8/29]
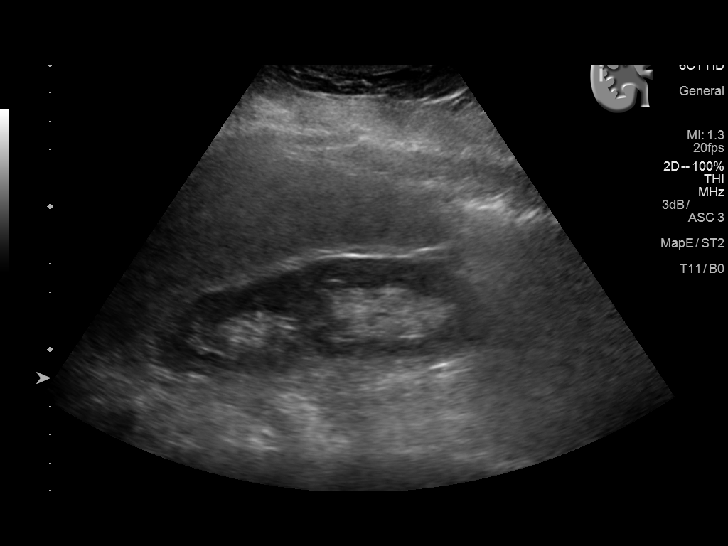
[im 10/29]
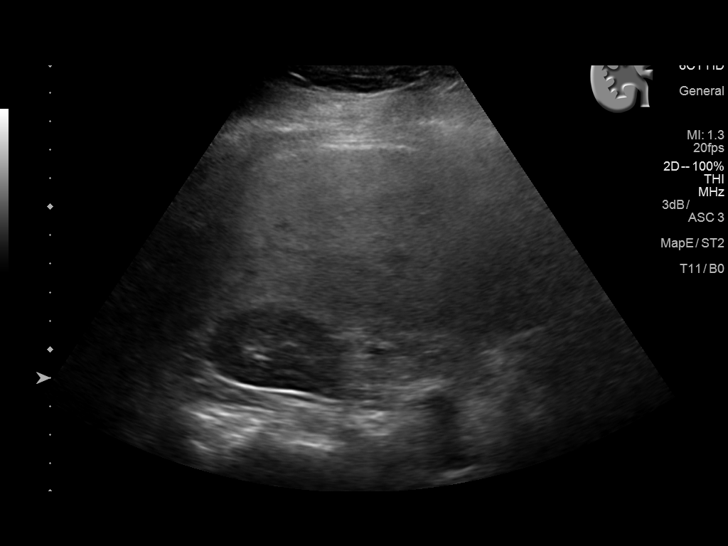
[im 11/29]
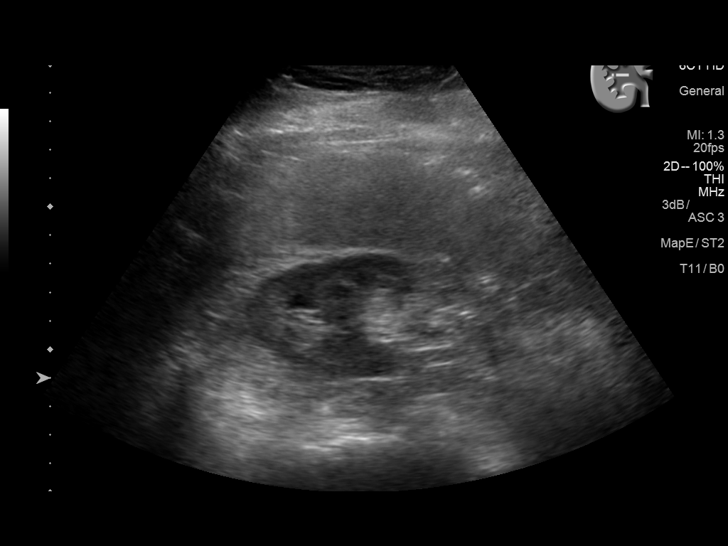
[im 13/29]
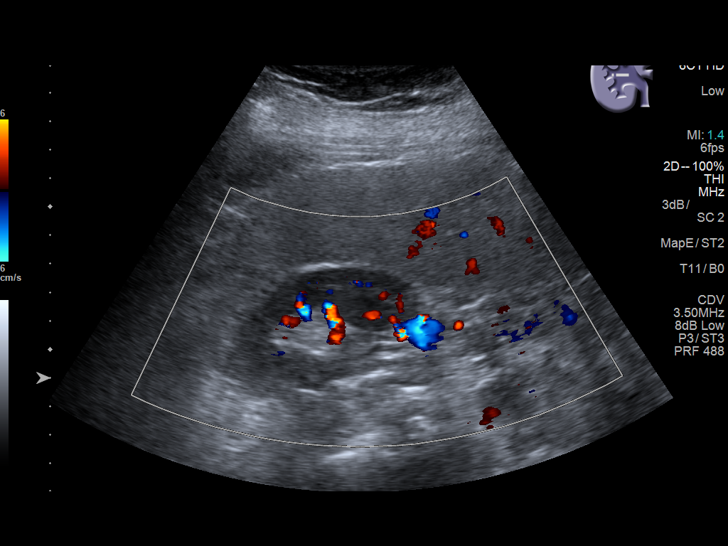
[im 16/29]
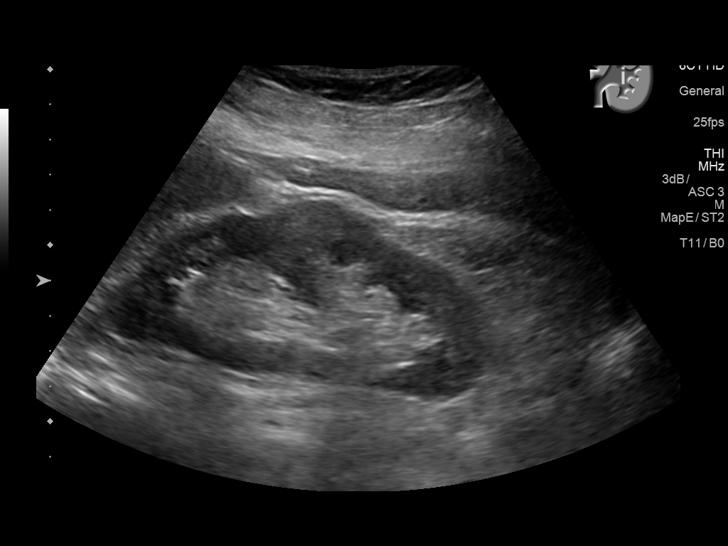
[im 18/29]
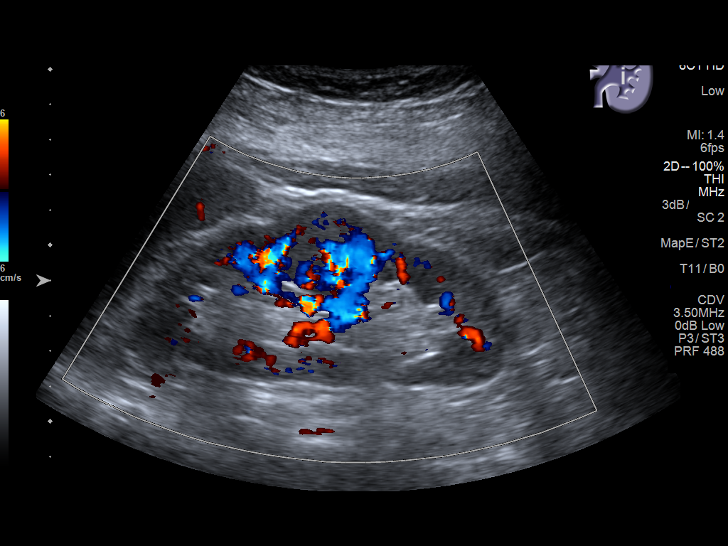
[im 19/29]
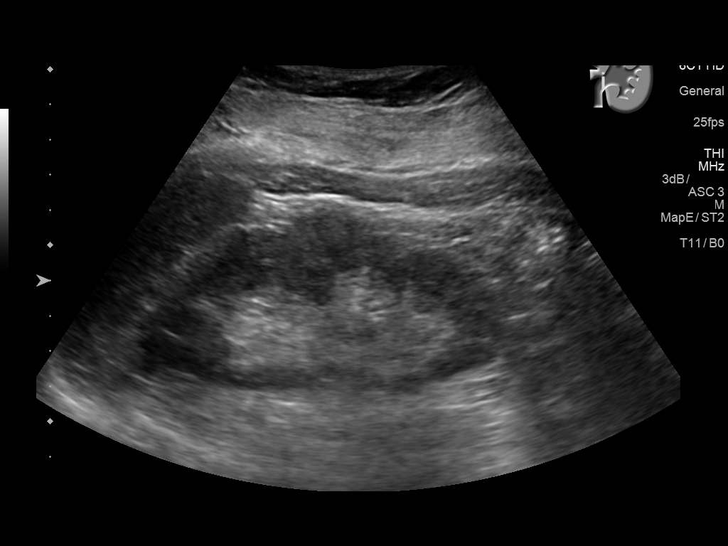
[im 22/29]
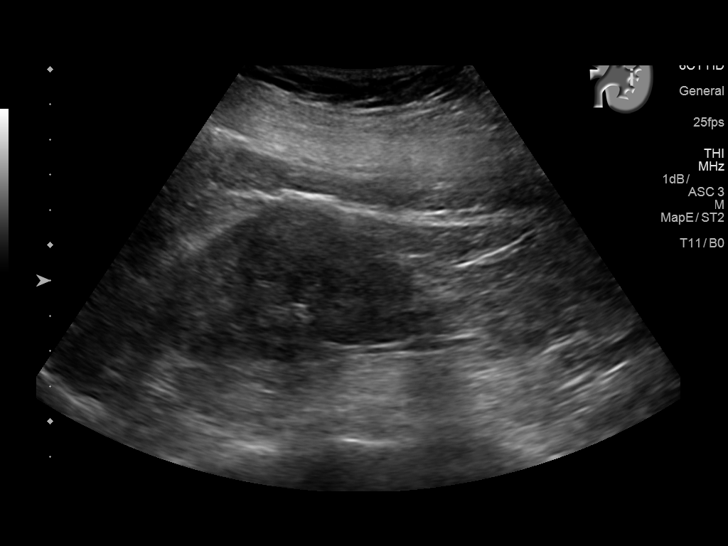
[im 24/29]
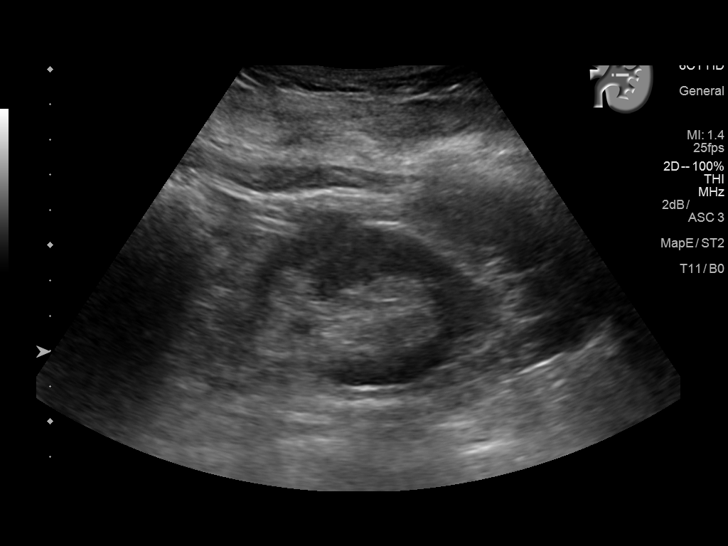
[im 26/29]
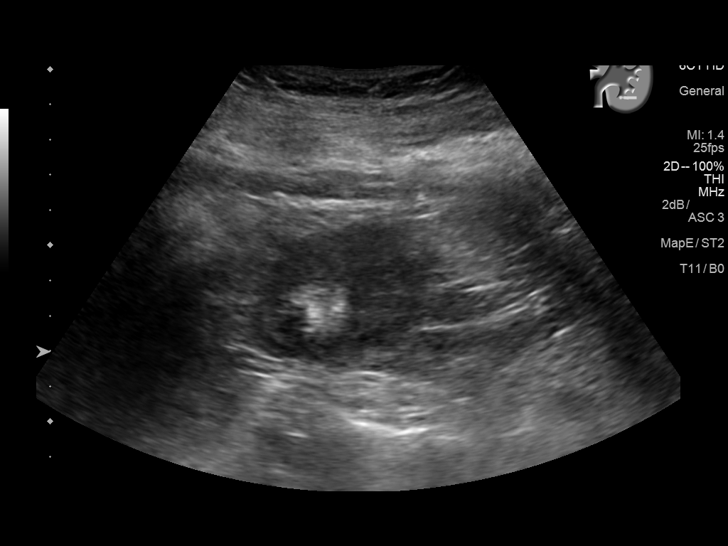
[im 29/29]
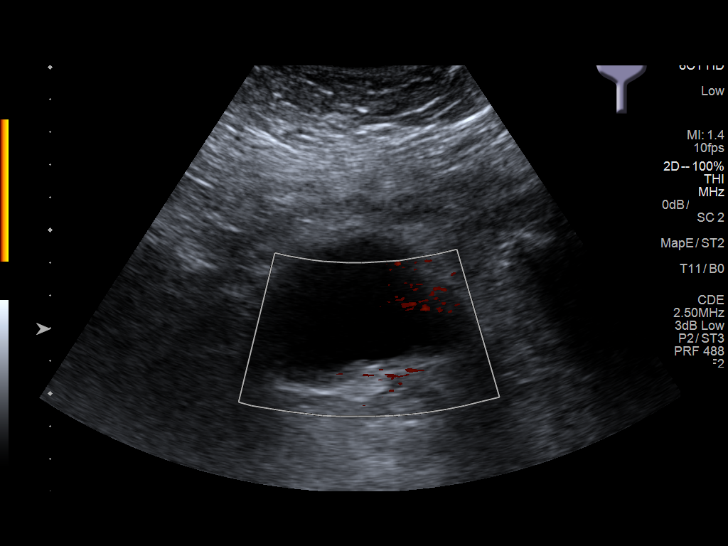

[14 of 25 positions shown; findings below may reference images not displayed]

FINDINGS: Right Kidney:

Length: 11.8 cm. Echogenicity within normal limits. No mass or
hydronephrosis visualized.

Left Kidney:

Length: 10.8 cm. Echogenicity within normal limits. No mass or
hydronephrosis visualized.

Bladder:

Appears normal for degree of bladder distention.
IMPRESSION: No acute abnormality identified.  No hydronephrosis bilaterally.

## 2017-02-08 IMAGING — US US PELVIS COMPLETE
1 series · 14 of 25 positions shown · non-contrast
Comparison: Pelvic ultrasound 05/11/2012

CLINICAL DATA: Right lower abdominal pain. Prior hysterectomy an
unilateral oophorectomy.



[Series 1: us pelvis complete · 0.28mm/px · 14 of 28 slices shown]
[im 1/28]
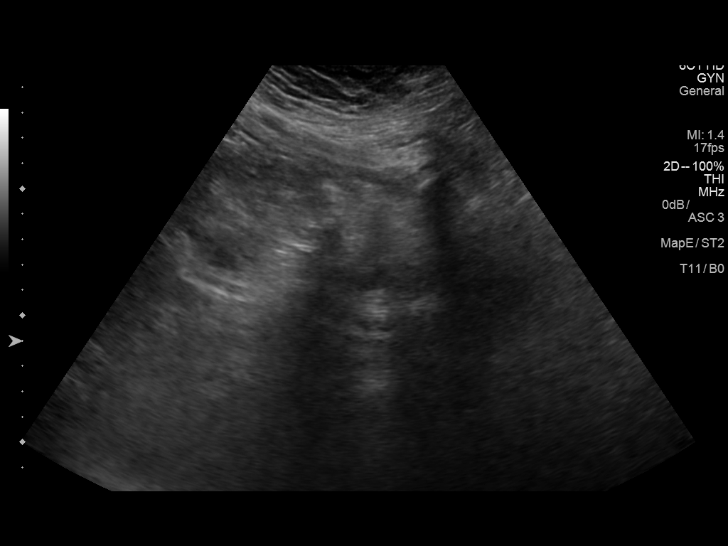
[im 3/28]
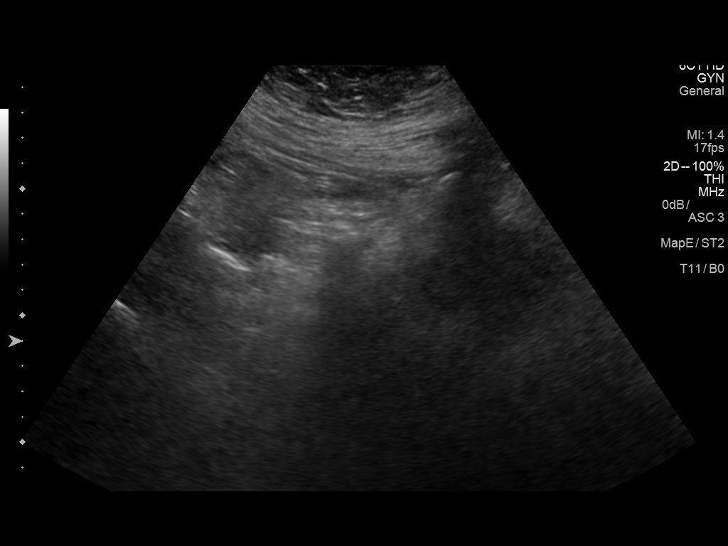
[im 5/28]
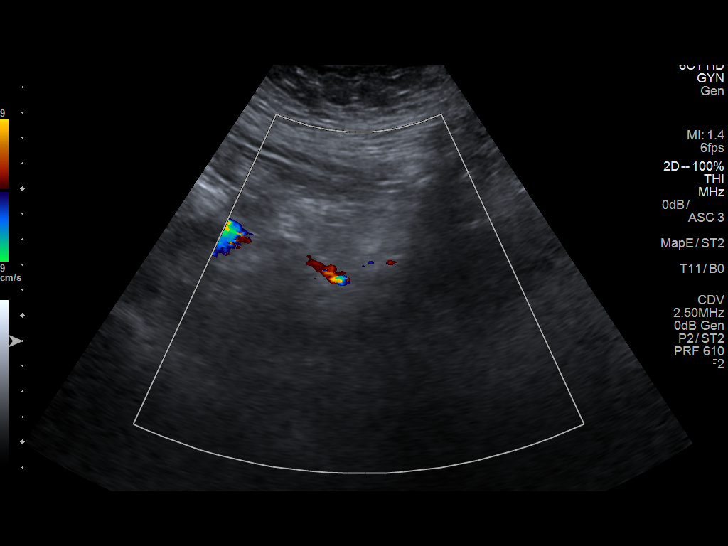
[im 7/28]
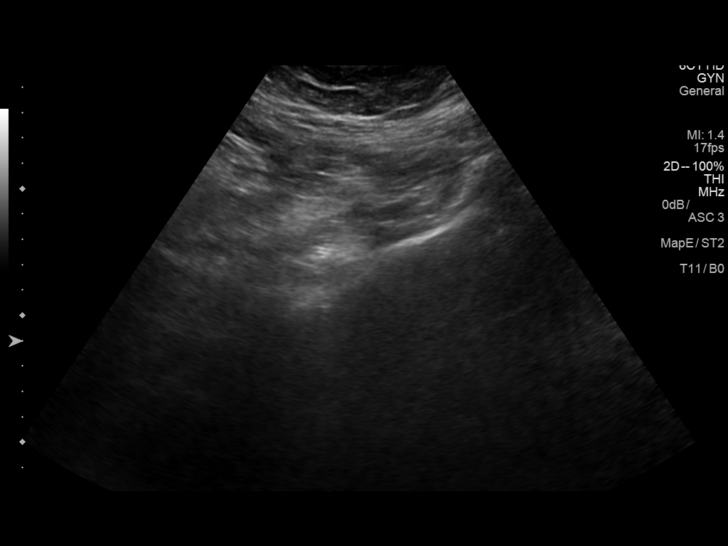
[im 10/28]
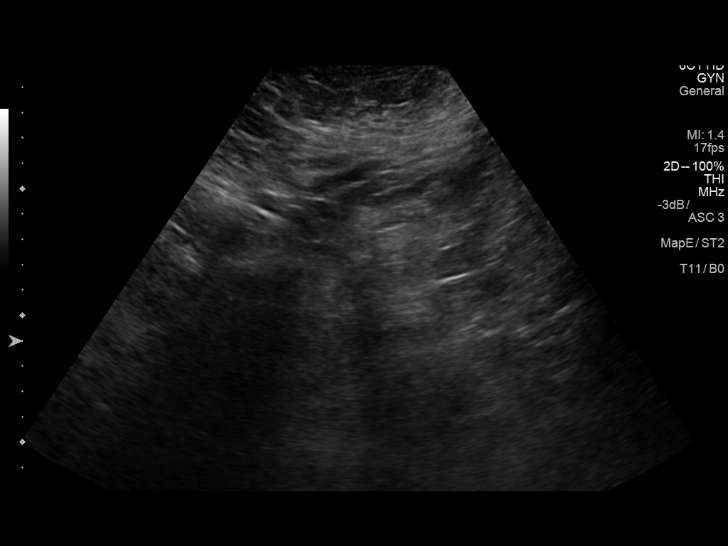
[im 11/28]
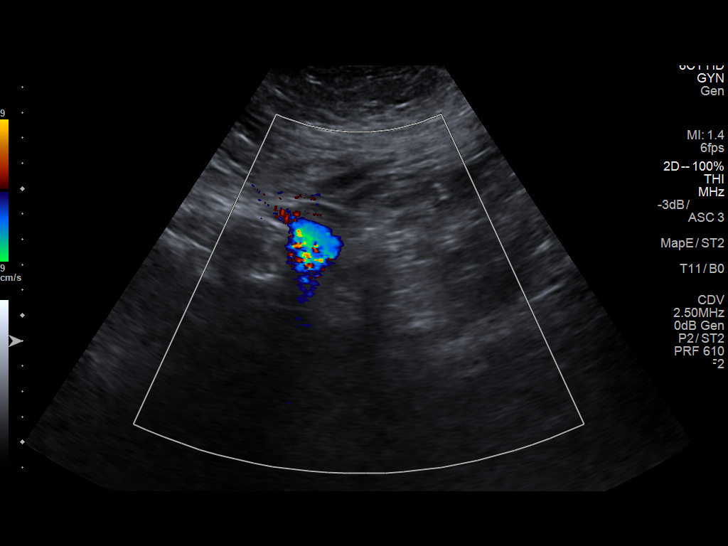
[im 13/28]
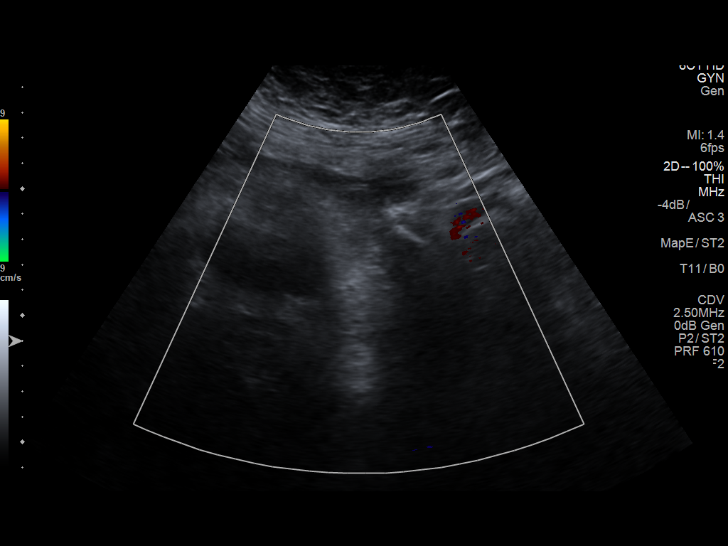
[im 15/28]
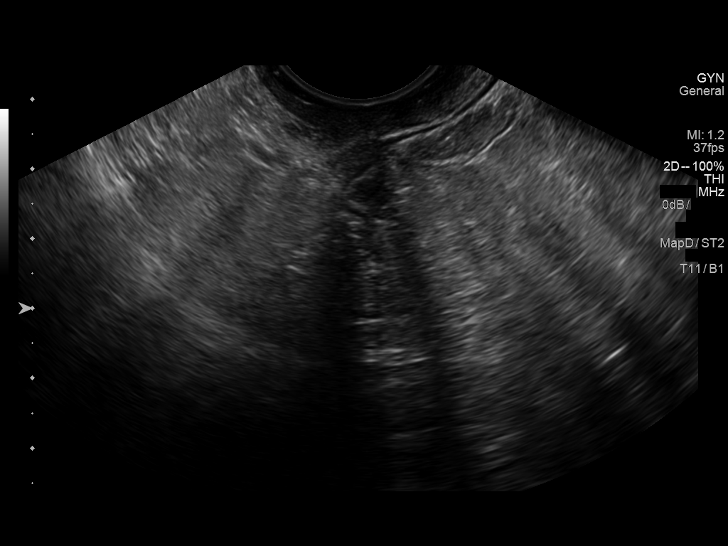
[im 17/28]
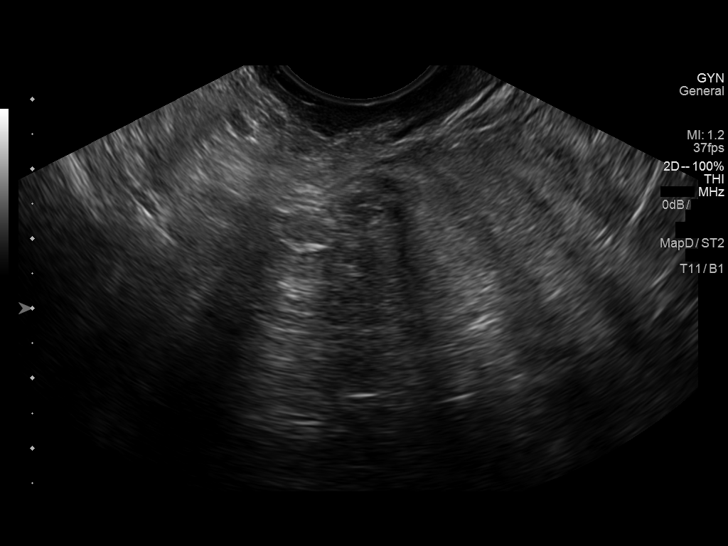
[im 19/28]
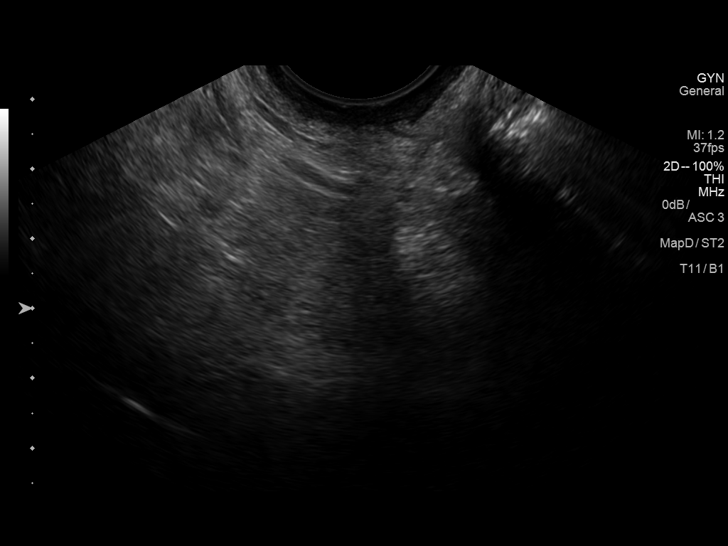
[im 21/28]
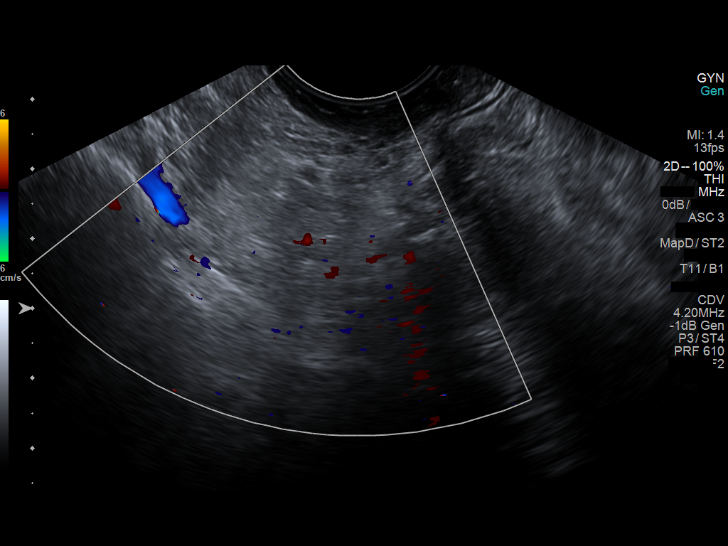
[im 23/28]
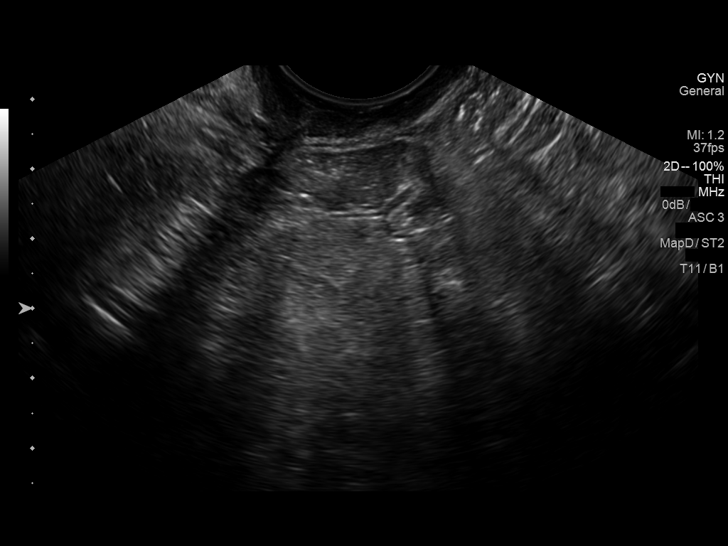
[im 25/28]
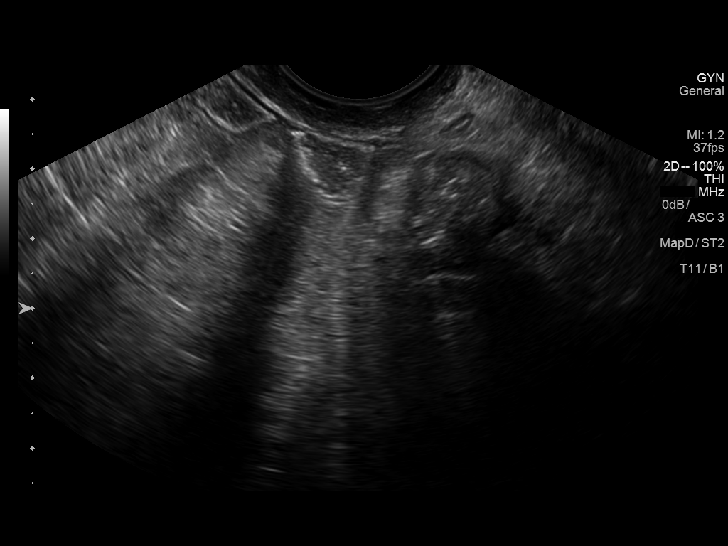
[im 28/28]
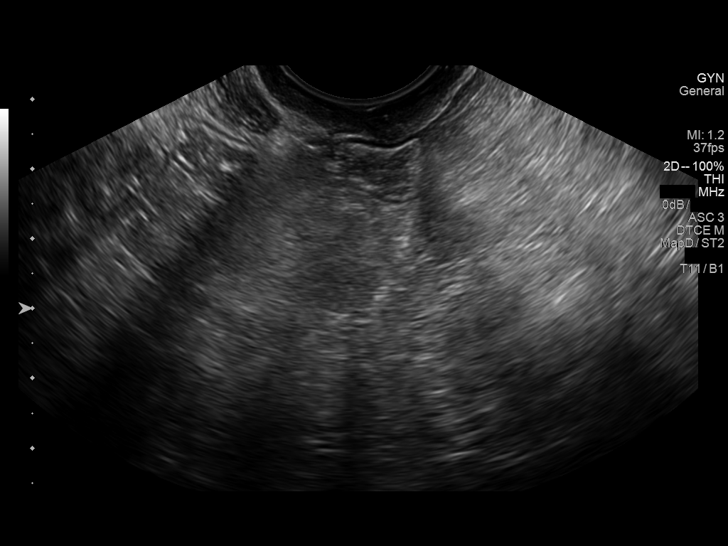

[14 of 25 positions shown; findings below may reference images not displayed]

FINDINGS: Uterus

Measurements: Prior hysterectomy.

Endometrium

Thickness: N/A.

Right ovary

Measurements: Not visualized. No adnexal masses seen.

Left ovary

Measurements: Nonvisualized. No adnexal masses seen.

Other findings

No free fluid.
IMPRESSION: No acute findings or pelvic masses visualized.

## 2017-02-15 DIAGNOSIS — I2699 Other pulmonary embolism without acute cor pulmonale: Secondary | ICD-10-CM | POA: Insufficient documentation

## 2017-02-23 ENCOUNTER — Ambulatory Visit: Payer: Medicare Other | Admitting: Oncology

## 2017-03-09 ENCOUNTER — Inpatient Hospital Stay: Payer: Medicare Other | Attending: Oncology | Admitting: Oncology

## 2017-03-09 ENCOUNTER — Inpatient Hospital Stay: Payer: Medicare Other | Admitting: Oncology

## 2017-03-09 ENCOUNTER — Encounter: Payer: Self-pay | Admitting: Oncology

## 2017-03-09 ENCOUNTER — Telehealth: Payer: Self-pay | Admitting: *Deleted

## 2017-03-09 VITALS — BP 145/81 | HR 101 | Temp 97.5°F | Resp 18 | Wt 237.1 lb

## 2017-03-09 DIAGNOSIS — Z923 Personal history of irradiation: Secondary | ICD-10-CM | POA: Diagnosis not present

## 2017-03-09 DIAGNOSIS — Z79811 Long term (current) use of aromatase inhibitors: Secondary | ICD-10-CM

## 2017-03-09 DIAGNOSIS — Z86711 Personal history of pulmonary embolism: Secondary | ICD-10-CM | POA: Diagnosis not present

## 2017-03-09 DIAGNOSIS — Z7982 Long term (current) use of aspirin: Secondary | ICD-10-CM | POA: Insufficient documentation

## 2017-03-09 DIAGNOSIS — Z7901 Long term (current) use of anticoagulants: Secondary | ICD-10-CM | POA: Diagnosis not present

## 2017-03-09 DIAGNOSIS — C50412 Malignant neoplasm of upper-outer quadrant of left female breast: Secondary | ICD-10-CM

## 2017-03-09 DIAGNOSIS — Z170 Estrogen receptor positive status [ER+]: Secondary | ICD-10-CM | POA: Diagnosis not present

## 2017-03-09 DIAGNOSIS — I2699 Other pulmonary embolism without acute cor pulmonale: Secondary | ICD-10-CM

## 2017-03-09 DIAGNOSIS — M8580 Other specified disorders of bone density and structure, unspecified site: Secondary | ICD-10-CM | POA: Diagnosis not present

## 2017-03-09 DIAGNOSIS — I10 Essential (primary) hypertension: Secondary | ICD-10-CM | POA: Insufficient documentation

## 2017-03-09 LAB — CBC
HCT: 35.5 % (ref 35.0–47.0)
Hemoglobin: 11.7 g/dL — ABNORMAL LOW (ref 12.0–16.0)
MCH: 25.6 pg — ABNORMAL LOW (ref 26.0–34.0)
MCHC: 32.9 g/dL (ref 32.0–36.0)
MCV: 77.7 fL — ABNORMAL LOW (ref 80.0–100.0)
PLATELETS: 536 10*3/uL — AB (ref 150–440)
RBC: 4.56 MIL/uL (ref 3.80–5.20)
RDW: 16 % — AB (ref 11.5–14.5)
WBC: 6.8 10*3/uL (ref 3.6–11.0)

## 2017-03-09 LAB — TSH: TSH: 2.727 u[IU]/mL (ref 0.350–4.500)

## 2017-03-09 LAB — IRON AND TIBC
IRON: 60 ug/dL (ref 28–170)
SATURATION RATIOS: 13 % (ref 10.4–31.8)
TIBC: 470 ug/dL — ABNORMAL HIGH (ref 250–450)
UIBC: 410 ug/dL

## 2017-03-09 LAB — FERRITIN: Ferritin: 8 ng/mL — ABNORMAL LOW (ref 11–307)

## 2017-03-09 LAB — FOLATE: Folate: 60 ng/mL (ref >5.9)

## 2017-03-09 NOTE — Progress Notes (Signed)
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  Chief complaint/ Reason for visit- routine f/u  Heme/Onc history:    Primary cancer of upper outer quadrant of left female breast ([place])   06/25/2013 Initial Diagnosis    Breast CA ([place]), left breast, T1a N0 M0, stage IA ER/PR positive HER-2 receptor-negative by [facility]      06/25/2013 Surgery    Left partial mastectomy       - 09/12/2013 Radiation Therapy    Completed radiation       - 12/11/2013 Anti-estrogen oral therapy    Discontinue letrozole due to side effect      12/11/2013 -  Anti-estrogen oral therapy    Started Arimidex      After starting arimidex, patient had b/l PE in march 2017. She was on 6 months of anticoagulation and then she stopped it. CT PE inoctober 2017 showed no PE. She again had b/l PE in April 2018 and is currently on xarelto  Interval history- feels fatigued. Denies other complaints. No bleeding or bruising  ECOG PS- 1 Pain scale- 0   Review of systems- Review of Systems  Constitutional: Positive for malaise/fatigue. Negative for chills, fever and weight loss.  HENT: Negative for congestion, ear discharge and nosebleeds.   Eyes: Negative for blurred vision.  Respiratory: Negative for cough, hemoptysis, sputum production, shortness of breath and wheezing.   Cardiovascular: Negative for chest pain, palpitations, orthopnea and claudication.  Gastrointestinal: Negative for abdominal pain, blood in stool, constipation, diarrhea, heartburn, melena, nausea and vomiting.  Genitourinary: Negative for dysuria, flank pain, frequency, hematuria and urgency.  Musculoskeletal: Negative for back pain, joint pain and myalgias.    Skin: Negative for rash.  Neurological: Negative for dizziness, tingling, focal weakness, seizures, weakness and headaches.  Endo/Heme/Allergies: Does not bruise/bleed easily.  Psychiatric/Behavioral: Negative for depression and suicidal ideas. The patient does not have insomnia.       Allergies  Allergen Reactions  . Actifed Cold-Allergy [Chlorpheniramine-Phenylephrine] Palpitations  . Septra [Sulfamethoxazole-Trimethoprim] Rash  . Sulfa Antibiotics Rash     Past Medical History:  Diagnosis Date  . Breast cancer ([place]) 2014   left breast with rad tx  . Cancer [facility]) 2014   Left Breast CA with Lumpectomy and 36 Rad tx's.  Marland Kitchen Hypertension      Past Surgical History:  Procedure Laterality Date  . BREAST BIOPSY Left 06/06/2013   positive    Social History   Social History  . Marital status: Widowed    Spouse name: N/A  . Number of children: N/A  . Years of education: N/A   Occupational History  . Not on file.   Social History Main Topics  . Smoking status: Never Smoker  . Smokeless tobacco: Never Used  . Alcohol use Not on file  . Drug use: Unknown  . Sexual activity: Not on file   Other Topics Concern  . Not on file   Social History Narrative  . No narrative on file    Family History  Problem Relation Age of Onset  . Breast cancer Maternal Aunt        60's  . Cancer Maternal Aunt   . Mitral valve prolapse Mother   . Cancer  Father   . AAA (abdominal aortic aneurysm) Father   . Rheumatic fever Sister      Current Outpatient Prescriptions:  .  acetaminophen (TYLENOL) 500 MG tablet, Take 500 mg by mouth every 6 (six) hours as needed., Disp: , Rfl:  .  aspirin 325 MG tablet, Take 325 mg by mouth daily., Disp: , Rfl:  .  atorvastatin (LIPITOR) 40 MG tablet, Take 40 mg by mouth daily at 6 PM. , Disp: , Rfl:  .  hydrochlorothiazide (HYDRODIURIL) 25 MG tablet, Take 25 mg by mouth as needed. , Disp: , Rfl:  .  Melatonin 5 MG CAPS, Take by mouth., Disp: ,  Rfl:  .  metoprolol (LOPRESSOR) 50 MG tablet, Take 50 mg by mouth daily. , Disp: , Rfl:  .  Multiple Vitamin (MULTI-VITAMINS) TABS, Take 1 tablet by mouth daily. , Disp: , Rfl:  .  Omega-3 Fatty Acids (FISH OIL PO), Take 1,000 mg by mouth daily., Disp: , Rfl:  .  pantoprazole (PROTONIX) 40 MG tablet, Take 40 mg by mouth daily. , Disp: , Rfl:  .  traMADol (ULTRAM) 50 MG tablet, Take by mouth every 6 (six) hours as needed., Disp: , Rfl:  .  venlafaxine XR (EFFEXOR-XR) 150 MG 24 hr capsule, Take 150 mg by mouth daily with breakfast. , Disp: , Rfl:  .  XARELTO 20 MG TABS tablet, Take 20 mg by mouth daily., Disp: , Rfl:  .  anastrozole (ARIMIDEX) 1 MG tablet, Take 1 tablet (1 mg total) by mouth daily. (Patient not taking: Reported on 01/23/2017), Disp: 90 tablet, Rfl: 1 .  MELOXICAM PO, Take 1 tablet by mouth daily., Disp: , Rfl:  .  Rivaroxaban 15 & 20 MG TBPK, Take as directed on package: Start with one 6m tablet by mouth twice a day with food. On Day 22, switch to one 270mtablet once a day with food. (Patient not taking: Reported on 03/09/2017), Disp: 51 each, Rfl: 0  Physical exam:  Vitals:   03/09/17 1046  BP: (!) 145/81  Pulse: (!) 101  Resp: 18  Temp: 97.5 F (36.4 C)  TempSrc: Tympanic  Weight: 237 lb 2 oz (107.6 kg)   Physical Exam  Constitutional: She is oriented to person, place, and time and well-developed, well-nourished, and in no distress.  HENT:  Head: Normocephalic and atraumatic.  Eyes: EOM are normal. Pupils are equal, round, and reactive to light.  Neck: Normal range of motion.  Cardiovascular: Normal rate, regular rhythm and normal heart sounds.   Pulmonary/Chest: Effort normal and breath sounds normal.  Abdominal: Soft. Bowel sounds are normal.  Neurological: She is alert and oriented to person, place, and time.  Skin: Skin is warm and dry.   Breast exam was performed in seated and lying down position. Patient is status post left lumpectomy with a well-healed  surgical scar. No evidence of any palpable masses. No evidence of axillary adenopathy. No evidence of any palpable masses or lumps in the right breast. No evidence of right axillary adenopathy   CMP Latest Ref Rng & Units 01/06/2017  Glucose 65 - 99 mg/dL 86  BUN 6 - 20 mg/dL 23(H)  Creatinine 0.44 - 1.00 mg/dL 1.25(H)  Sodium 135 - 145 mmol/L 139  Potassium 3.5 - 5.1 mmol/L 4.5  Chloride 101 - 111 mmol/L 103  CO2 22 - 32 mmol/L 26  Calcium 8.9 - 10.3 mg/dL 10.0  Total Protein 6.5 - 8.1 g/dL -  Total Bilirubin 0.3 - 1.2 mg/dL -  Alkaline Phos 38 - 126 U/L -  AST 15 - 41 U/L -  ALT 14 - 54 U/L -   CBC Latest Ref Rng & Units 03/09/2017  WBC 3.6 - 11.0 K/uL 6.8  Hemoglobin 12.0 - 16.0 g/dL 11.7(L)  Hematocrit 35.0 - 47.0 % 35.5  Platelets 150 - 440 K/uL 536(H)     Assessment and plan- Patient is a 71 y.o. female who sess [place] for following issues:  1. Left breast cancer stage I- arimidex was stopped after second PE. She has 1 more year of hormone therapy remaining. Since she is on xarelto, it would be ok for her to go back on arimidex. I also discussed switching her to aromasin which has less than 1% chance of thromboembolism. Will order repeat mamogram in October 2018. Clinically no evidence of recurrence on todays exam. Bone density from jan 2018 showed osteopenia which was stable. Continue calcium and vit D  2. B/l PE- she has had bilateral PE since starting armidex. It is unclear if arimidex was truly the provoking factor. No prior PE before usign arimidex. Given 2 episodes of b/l PE, I think she will benefit from indefinite anticoagulation at this point periodically weighing risks and benefits of bleeding versus clotting  3. Patient notes that her hb was low on rcent check when it was 10.8 on 02/15/17. Repeat cbc, iron studies, b12, folate and TSH today  rtc in 3 months with cbc   Visit Diagnosis 1. Primary cancer of upper outer quadrant of left female breast ([place])   2. Other acute  pulmonary embolism without acute cor pulmonale (HCC)      Dr. Randa Evens, MD, MPH [place] [facility] Pager- [phone] 03/09/2017 12:21 PM

## 2017-03-09 NOTE — Progress Notes (Signed)
Patient was in the hospital in April for bilateral PE's.  States she continues to be SOB.  States she still does not feel 100%.

## 2017-03-09 NOTE — Progress Notes (Signed)
She has iron deficiency. Please tell her to start taking oral iron 325 mg BID. Call if she cannot tolerate oral iron

## 2017-03-09 NOTE — Telephone Encounter (Signed)
Called patient to inform her that she is iron deficient.  MD recommended patient take 325 mg oral iron BID. Instructed patient to take her iron with orange juice or vitamin C. Patient repeated back to me.  Also instructed her to call back if she cannot tolerate the iron.  Patient verbalized understanding.

## 2017-03-09 NOTE — Telephone Encounter (Signed)
-----   Message from Donnita Falls, RN sent at 03/09/2017  3:06 PM EDT -----   ----- Message ----- From: Sindy Guadeloupe, MD Sent: 03/09/2017   2:59 PM To: Donnita Falls, RN  She has iron deficiency. Please tell her to start taking oral iron 325 mg BID. Call if she cannot tolerate oral iron

## 2017-03-10 LAB — VITAMIN B12: Vitamin B-12: 1350 pg/mL — ABNORMAL HIGH (ref 180–914)

## 2017-06-07 ENCOUNTER — Telehealth: Payer: Self-pay | Admitting: *Deleted

## 2017-06-07 NOTE — Telephone Encounter (Signed)
Patient has appointment for lab/ doctor Friday, but she just had labs drawn at [facility] yesterday and is asking if she really needs labs Friday.     Ref Range & Units 06/06/17 0804  02/15/17 1623  01/11/17 1459  12/01/16 0911  05/12/16 0909   WBC (White Blood Cell Count) 4.1 - 10.2 10^3/uL 6.2 6.7 8.9 8.3 6.5  RBC (Red Blood Cell Count) 4.04 - 5.48 10^6/uL 4.70 4.08 4.36 4.51 4.60  Hemoglobin 12.0 - 15.0 gm/dL 13.8 10.8  11.6  12.3 14.1  Hematocrit 35.0 - 47.0 % 42.2 33.9  36.2 38.9 42.0  MCV (Mean Corpuscular Volume) 80.0 - 100.0 fl 89.8 83.1 83.0 86.3 91.3  MCH (Mean Corpuscular Hemoglobin) 27.0 - 31.2 pg 29.4 26.5  26.6  27.3 30.7  MCHC (Mean Corpuscular Hemoglobin Concentration) 32.0 - 36.0 gm/dL 32.7 31.9  32.0 31.6  33.6  Platelet Count 150 - 450 10^3/uL 402 455  539  471  396  RDW-CV (Red Cell Distribution Width) 11.6 - 14.8 % 18.1  14.8 15.7  14.3 13.5  MPV (Mean Platelet Volume) 9.4 - 12.4 fl 8.7  8.9  9.1  9.0  8.8   Neutrophils 1.50 - 7.80 10^3/uL 3.60 3.47 5.55 5.63 3.77  Lymphocytes 1.00 - 3.60 10^3/uL 1.85 2.16 2.30 1.78 1.90  Monocytes 0.00 - 1.50 10^3/uL 0.50 0.74 0.79 0.53 0.59  Eosinophils 0.00 - 0.55 10^3/uL 0.22 0.21 0.22 0.27 0.14  Basophils 0.00 - 0.09 10^3/uL 0.03 0.06 0.05 0.05 0.05  Neutrophil % 32.0 - 70.0 % 57.9 52.1 62.1 68.0 58.2  Lymphocyte % 10.0 - 50.0 % 29.8 32.5 25.8 21.5 29.4  Monocyte % 4.0 - 13.0 % 8.1 11.1 8.9 6.4 9.1  Eosinophil % 1.0 - 5.0 % 3.5 3.2 2.5 3.3 2.2  Basophil% 0.0 - 2.0 % 0.5 0.9 0.6 0.6 0.8  Immature Granulocyte % <=0.7 % 0.2 0.2 0.1 0.2 0.3  Immature Granulocyte Count <=0.06 10^3/L 0.01 0.01 0.01 0.02 0.02     Specimen Collected: 06/06/17 08:04 Last Resulted: 06/06/17 08:20

## 2017-06-07 NOTE — Telephone Encounter (Signed)
Appointment cancelled and left voice mail for patient

## 2017-06-07 NOTE — Telephone Encounter (Signed)
It was a cbc only and since she had one already on 9/25 she will not need a lab on Friday. I will ask scheduler to d/c the lab portion

## 2017-06-09 ENCOUNTER — Inpatient Hospital Stay: Payer: Medicare Other

## 2017-06-09 ENCOUNTER — Inpatient Hospital Stay: Payer: Medicare Other | Attending: Oncology | Admitting: Oncology

## 2017-06-09 ENCOUNTER — Other Ambulatory Visit: Payer: Self-pay | Admitting: *Deleted

## 2017-06-09 ENCOUNTER — Encounter: Payer: Self-pay | Admitting: Oncology

## 2017-06-09 VITALS — BP 124/81 | HR 71 | Temp 97.6°F | Wt 237.0 lb

## 2017-06-09 DIAGNOSIS — K5909 Other constipation: Secondary | ICD-10-CM | POA: Insufficient documentation

## 2017-06-09 DIAGNOSIS — M8580 Other specified disorders of bone density and structure, unspecified site: Secondary | ICD-10-CM | POA: Insufficient documentation

## 2017-06-09 DIAGNOSIS — Z79899 Other long term (current) drug therapy: Secondary | ICD-10-CM

## 2017-06-09 DIAGNOSIS — Z23 Encounter for immunization: Secondary | ICD-10-CM

## 2017-06-09 DIAGNOSIS — Z86711 Personal history of pulmonary embolism: Secondary | ICD-10-CM | POA: Insufficient documentation

## 2017-06-09 DIAGNOSIS — C50412 Malignant neoplasm of upper-outer quadrant of left female breast: Secondary | ICD-10-CM | POA: Diagnosis not present

## 2017-06-09 DIAGNOSIS — Z923 Personal history of irradiation: Secondary | ICD-10-CM | POA: Insufficient documentation

## 2017-06-09 DIAGNOSIS — D509 Iron deficiency anemia, unspecified: Secondary | ICD-10-CM | POA: Insufficient documentation

## 2017-06-09 DIAGNOSIS — Z79811 Long term (current) use of aromatase inhibitors: Secondary | ICD-10-CM | POA: Insufficient documentation

## 2017-06-09 DIAGNOSIS — I2782 Chronic pulmonary embolism: Secondary | ICD-10-CM

## 2017-06-09 DIAGNOSIS — Z7901 Long term (current) use of anticoagulants: Secondary | ICD-10-CM | POA: Diagnosis not present

## 2017-06-09 DIAGNOSIS — Z170 Estrogen receptor positive status [ER+]: Secondary | ICD-10-CM | POA: Diagnosis not present

## 2017-06-09 DIAGNOSIS — Z7982 Long term (current) use of aspirin: Secondary | ICD-10-CM | POA: Diagnosis not present

## 2017-06-09 MED ORDER — INFLUENZA VAC SPLIT QUAD 0.5 ML IM SUSY
0.5000 mL | PREFILLED_SYRINGE | Freq: Once | INTRAMUSCULAR | Status: AC
  Administered 2017-06-09: 0.5 mL via INTRAMUSCULAR

## 2017-06-09 MED ORDER — INFLUENZA VAC SPLIT QUAD 0.5 ML IM SUSY: 0.5000 mL | PREFILLED_SYRINGE | Freq: Once | INTRAMUSCULAR | Status: DC

## 2017-06-09 MED ORDER — INFLUENZA VAC SPLIT QUAD 0.5 ML IM SUSY: 0.5000 mL | PREFILLED_SYRINGE | INTRAMUSCULAR | Status: DC

## 2017-06-09 NOTE — Progress Notes (Signed)
Patient states that she is doing well. She does have some chronic back pain and some new right knee pain which she thinks might be a torn meniscus. She would like to get a flu shot today.

## 2017-06-09 NOTE — Progress Notes (Signed)
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  Chief complaint/ Reason for visit- routine f/u  Heme/Onc history:    Primary cancer of upper outer quadrant of left female breast ([place])   06/25/2013 Initial Diagnosis    Breast CA ([place]), left breast, T1a N0 M0, stage IA ER/PR positive HER-2 receptor-negative by [facility]      06/25/2013 Surgery    Left partial mastectomy       - 09/12/2013 Radiation Therapy    Completed radiation       - 12/11/2013 Anti-estrogen oral therapy    Discontinue letrozole due to side effect      12/11/2013 -  Anti-estrogen oral therapy    Started Arimidex      After starting arimidex, patient had b/l PE in march 2017. She was on 6 months of anticoagulation and then she stopped it. CT PE inoctober 2017 showed no PE. She again had b/l PE in April 2018 and is currently on xarelto.   Bone scan 1/18 revealed osteopenia. Patient on Calcium and vitamin d.   Blood work from 03/09/17 showed hmg 11.7, mcv 77.7, mch 25.6. Iron studies showed ferritin 8, tibc 470, saturation ratio 13, b12 elevated at 1350. Patient encouraged to take oral iron 325 mg BID with vitamin c.   Interval history- energy level has improved. Has constipation with oral iron. Has not tried otc agents for constipation. Denies bleeding, bruising. Denies other complaints. No bleeding or bruising  ECOG PS- 1 Pain scale- 0   Review of systems- Review of Systems  Constitutional: Negative for chills, fever, malaise/fatigue and weight loss.  HENT: Negative for congestion, ear discharge and nosebleeds.   Eyes: Negative for blurred vision.  Respiratory: Negative for cough, hemoptysis, sputum production, shortness of  breath and wheezing.   Cardiovascular: Negative for chest pain, palpitations, orthopnea, claudication and leg swelling.  Gastrointestinal: Negative for abdominal pain, blood in stool, constipation, diarrhea, heartburn, melena, nausea and vomiting.  Genitourinary: Negative for dysuria, flank pain, frequency, hematuria and urgency.  Musculoskeletal: Negative for back pain, joint pain and myalgias.  Skin: Negative for rash.  Neurological: Negative for dizziness, tingling, focal weakness, seizures, weakness and headaches.  Endo/Heme/Allergies: Does not bruise/bleed easily.  Psychiatric/Behavioral: Negative for depression and suicidal ideas. The patient does not have insomnia.       Allergies  Allergen Reactions  . Actifed Cold-Allergy [Chlorpheniramine-Phenylephrine] Palpitations  . Septra [Sulfamethoxazole-Trimethoprim] Rash  . Sulfa Antibiotics Rash     Past Medical History:  Diagnosis Date  . Breast cancer ([place]) 2014   left breast with rad tx  . Cancer [facility]) 2014   Left Breast CA with Lumpectomy and 36 Rad tx's.  Marland Kitchen Hypertension      Past Surgical History:  Procedure Laterality Date  . BREAST BIOPSY Left 06/06/2013   positive    Social History   Social History  . Marital status: Widowed    Spouse name: N/A  . Number of children: N/A  . Years of education: N/A   Occupational History  . Not on file.   Social History Main Topics  . Smoking status: Never Smoker  . Smokeless tobacco: Never Used  . Alcohol use Not on file  . Drug use: Unknown  .  Sexual activity: Not on file   Other Topics Concern  . Not on file   Social History Narrative  . No narrative on file    Family History  Problem Relation Age of Onset  . Breast cancer Maternal Aunt        60's  . Cancer Maternal Aunt   . Mitral valve prolapse Mother   . Cancer Father   . AAA (abdominal aortic aneurysm) Father   . Rheumatic fever Sister      Current Outpatient Prescriptions:  .  acetaminophen  (TYLENOL) 500 MG tablet, Take 500 mg by mouth every 6 (six) hours as needed., Disp: , Rfl:  .  anastrozole (ARIMIDEX) 1 MG tablet, Take 1 tablet (1 mg total) by mouth daily., Disp: 90 tablet, Rfl: 1 .  aspirin 325 MG tablet, Take 325 mg by mouth daily., Disp: , Rfl:  .  atorvastatin (LIPITOR) 40 MG tablet, Take 40 mg by mouth daily at 6 PM. , Disp: , Rfl:  .  ferrous sulfate 325 (65 FE) MG EC tablet, Take 325 mg by mouth 2 (two) times daily., Disp: , Rfl:  .  hydrochlorothiazide (HYDRODIURIL) 25 MG tablet, Take 25 mg by mouth as needed. , Disp: , Rfl:  .  Melatonin 5 MG CAPS, Take by mouth., Disp: , Rfl:  .  metoprolol (LOPRESSOR) 50 MG tablet, Take 50 mg by mouth daily. , Disp: , Rfl:  .  Multiple Vitamin (MULTI-VITAMINS) TABS, Take 1 tablet by mouth daily. , Disp: , Rfl:  .  Omega-3 Fatty Acids (FISH OIL PO), Take 1,000 mg by mouth daily., Disp: , Rfl:  .  pantoprazole (PROTONIX) 40 MG tablet, Take 40 mg by mouth daily. , Disp: , Rfl:  .  traMADol (ULTRAM) 50 MG tablet, Take by mouth every 6 (six) hours as needed., Disp: , Rfl:  .  venlafaxine XR (EFFEXOR-XR) 150 MG 24 hr capsule, Take 150 mg by mouth daily with breakfast. , Disp: , Rfl:  .  XARELTO 20 MG TABS tablet, Take 20 mg by mouth daily., Disp: , Rfl:  .  MELOXICAM PO, Take 1 tablet by mouth daily., Disp: , Rfl:  No current facility-administered medications for this visit.   Facility-Administered Medications Ordered in Other Visits:  .  Influenza vac split quadrivalent PF (FLUARIX) injection 0.5 mL, 0.5 mL, Intramuscular, Once, Sindy Guadeloupe, MD  Physical exam:  Vitals:   06/09/17 0948  BP: 124/81  Pulse: 71  Temp: 97.6 F (36.4 C)  TempSrc: Tympanic  Weight: 237 lb (107.5 kg)   Physical Exam  Constitutional: She is oriented to person, place, and time and well-developed, well-nourished, and in no distress.  HENT:  Head: Normocephalic and atraumatic.  Eyes: Pupils are equal, round, and reactive to light. EOM are normal.    Neck: Normal range of motion.  Cardiovascular: Normal rate, regular rhythm and normal heart sounds.   Pulmonary/Chest: Effort normal and breath sounds normal.  Abdominal: Soft. Bowel sounds are normal.  Neurological: She is alert and oriented to person, place, and time.  Skin: Skin is warm and dry.  Breast exam deferred to next visit.    CMP Latest Ref Rng & Units 01/06/2017  Glucose 65 - 99 mg/dL 86  BUN 6 - 20 mg/dL 23(H)  Creatinine 0.44 - 1.00 mg/dL 1.25(H)  Sodium 135 - 145 mmol/L 139  Potassium 3.5 - 5.1 mmol/L 4.5  Chloride 101 - 111 mmol/L 103  CO2 22 - 32 mmol/L 26  Calcium 8.9 -  10.3 mg/dL 10.0  Total Protein 6.5 - 8.1 g/dL -  Total Bilirubin 0.3 - 1.2 mg/dL -  Alkaline Phos 38 - 126 U/L -  AST 15 - 41 U/L -  ALT 14 - 54 U/L -   CBC Latest Ref Rng & Units 03/09/2017  WBC 3.6 - 11.0 K/uL 6.8  Hemoglobin 12.0 - 16.0 g/dL 11.7(L)  Hematocrit 35.0 - 47.0 % 35.5  Platelets 150 - 440 K/uL 536(H)     Assessment and plan- Patient is a 71 y.o. female who sess [place] for following issues:  1. Left breast cancer stage I- arimidex was stopped after second PE. She has 1 more year of hormone therapy remaining completing AI therapy in 2019. She is tolerating Arimidex well since restarting it 3 months ago. Denies side effects. She is not interested in switching to Aromasin despite less than 1% chance of thromboembolism due to previously taking this and having hot flashes. Mammogram scheduled for 10/18. Will perform breast exam at her next visit. Bone density scan 1/18 showed osteopenia. Patient on calcium and vitamin d. Discussed increasing to Ca 1200 mg and vit d 800 u daily and encouraged weight bearing exercise. Will repeat bone density scan 09/2018.   2. B/l PE- pt suffered bilateral PE when on Arimidex though unclear if arimidex was provoking factor. Further discussed benefit from lifelong anticoagulation with periodically weighing risks and benefits of bleeding versus clotting.   3.  Iron deficiency anemia - Labs from 06/06/17 from [facility] reviewed. Hmg 13.8, mcv 89.8, mch 29.4, platelets 402, wbc 6.2. Patient's blood levels have improved. She does have constipation. Discussed using miralax or senna otc for improvement in symptoms. Patient has previously had IV Feraheme which could be considered if she is unable to tolerate oral iron. In the setting of anticoagulation and anemia, I think she would benefit from a work up through GI. She has seen Dr. Vira Agar in the past and is due for colonoscopy next year. Will refer her back to GI for evaluation.  rtc in 3 months with cbc and iron studies.    Visit Diagnosis 1. Primary cancer of upper outer quadrant of left female breast ([place])   2. Iron deficiency anemia, unspecified iron deficiency anemia type   3. Need for prophylactic vaccination and inoculation against influenza    Beckey Rutter, NP 06/09/17 10:44 AM   Dr. Randa Evens, MD, MPH [place] at [facility] Pager- [phone] 06/09/2017 10:44 AM

## 2017-06-09 NOTE — Progress Notes (Unsigned)
Called Dr. Percell Boston nurse and requested to have Beth Hoover's colonoscopy moved up from next year, to an earlier date, due to her recent IDA, per Dr. Elroy Channel request.  Patient was called to inform her of the message sent to Dr. Vira Agar and was instructed to call us if she does not hear from his office in 2 weeks.

## 2017-06-12 ENCOUNTER — Other Ambulatory Visit: Payer: Self-pay | Admitting: *Deleted

## 2017-06-13 ENCOUNTER — Ambulatory Visit: Payer: Medicare Other | Admitting: Oncology

## 2017-06-13 ENCOUNTER — Other Ambulatory Visit: Payer: Medicare Other

## 2017-07-11 ENCOUNTER — Ambulatory Visit
Admission: RE | Admit: 2017-07-11 | Discharge: 2017-07-11 | Disposition: A | Discharge status: Home / Self Care | Payer: Medicare Other | Source: Ambulatory Visit | Attending: Oncology | Admitting: Oncology

## 2017-07-11 DIAGNOSIS — Z853 Personal history of malignant neoplasm of breast: Secondary | ICD-10-CM | POA: Insufficient documentation

## 2017-07-11 DIAGNOSIS — Z923 Personal history of irradiation: Secondary | ICD-10-CM | POA: Diagnosis not present

## 2017-07-11 DIAGNOSIS — C50412 Malignant neoplasm of upper-outer quadrant of left female breast: Secondary | ICD-10-CM

## 2017-07-19 ENCOUNTER — Other Ambulatory Visit: Payer: Self-pay | Admitting: Oncology

## 2017-09-14 DIAGNOSIS — M25561 Pain in right knee: Secondary | ICD-10-CM | POA: Insufficient documentation

## 2017-09-14 DIAGNOSIS — M1711 Unilateral primary osteoarthritis, right knee: Secondary | ICD-10-CM | POA: Insufficient documentation

## 2017-09-15 ENCOUNTER — Other Ambulatory Visit: Payer: Self-pay | Admitting: *Deleted

## 2017-09-15 ENCOUNTER — Inpatient Hospital Stay (HOSPITAL_BASED_OUTPATIENT_CLINIC_OR_DEPARTMENT_OTHER): Payer: Medicare Other | Admitting: Oncology

## 2017-09-15 ENCOUNTER — Inpatient Hospital Stay: Payer: Medicare Other | Attending: Oncology

## 2017-09-15 ENCOUNTER — Encounter: Payer: Self-pay | Admitting: Oncology

## 2017-09-15 VITALS — BP 110/79 | HR 65 | Temp 97.8°F | Resp 16 | Wt 238.0 lb

## 2017-09-15 DIAGNOSIS — D509 Iron deficiency anemia, unspecified: Secondary | ICD-10-CM

## 2017-09-15 DIAGNOSIS — Z7901 Long term (current) use of anticoagulants: Secondary | ICD-10-CM | POA: Insufficient documentation

## 2017-09-15 DIAGNOSIS — Z862 Personal history of diseases of the blood and blood-forming organs and certain disorders involving the immune mechanism: Secondary | ICD-10-CM | POA: Insufficient documentation

## 2017-09-15 DIAGNOSIS — C50412 Malignant neoplasm of upper-outer quadrant of left female breast: Secondary | ICD-10-CM | POA: Insufficient documentation

## 2017-09-15 DIAGNOSIS — Z79811 Long term (current) use of aromatase inhibitors: Secondary | ICD-10-CM

## 2017-09-15 DIAGNOSIS — D649 Anemia, unspecified: Secondary | ICD-10-CM

## 2017-09-15 DIAGNOSIS — Z86711 Personal history of pulmonary embolism: Secondary | ICD-10-CM | POA: Insufficient documentation

## 2017-09-15 DIAGNOSIS — Z923 Personal history of irradiation: Secondary | ICD-10-CM | POA: Diagnosis not present

## 2017-09-15 LAB — IRON AND TIBC
Iron: 86 ug/dL (ref 28–170)
Saturation Ratios: 23 % (ref 10.4–31.8)
TIBC: 367 ug/dL (ref 250–450)
UIBC: 281 ug/dL

## 2017-09-15 LAB — CBC
HCT: 40.7 % (ref 35.0–47.0)
HEMOGLOBIN: 13.8 g/dL (ref 12.0–16.0)
MCH: 31.3 pg (ref 26.0–34.0)
MCHC: 33.9 g/dL (ref 32.0–36.0)
MCV: 92.4 fL (ref 80.0–100.0)
Platelets: 393 10*3/uL (ref 150–440)
RBC: 4.4 MIL/uL (ref 3.80–5.20)
RDW: 13.5 % (ref 11.5–14.5)
WBC: 7.6 10*3/uL (ref 3.6–11.0)

## 2017-09-15 LAB — FERRITIN: FERRITIN: 24 ng/mL (ref 11–307)

## 2017-09-22 NOTE — Progress Notes (Signed)
Hematology/Oncology Consult note [facility]  [phone] [phone]  Patient Care Team: Idelle Crouch, MD as PCP - General (Internal Medicine)   Name of the patient: Beth Hoover  [id number]  1946-08-04   Date of visit: 09/22/17  Diagnosis- 1. 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 Iron deficiency anemia  Chief complaint/ Reason for visit- routine f/u of breast cancer and anemia  Heme/Onc history:    Primary cancer of upper outer quadrant of left female breast ([place])   06/25/2013 Initial Diagnosis    Breast CA ([place]), left breast, T1a N0 M0, stage IA ER/PR positive HER-2 receptor-negative by [facility]      06/25/2013 Surgery    Left partial mastectomy       - 09/12/2013 Radiation Therapy    Completed radiation       - 12/11/2013 Anti-estrogen oral therapy    Discontinue letrozole due to side effect      12/11/2013 -  Anti-estrogen oral therapy    Started Arimidex       After starting arimidex, patient had b/l PE in march 2017. She was on 6 months of anticoagulation and then she stopped it. CT PE inoctober 2017 showed no PE. She again had b/l PE in April 2018 and is currently on xarelto    Interval history- doing well. Denies any complaints other than mild fatigue  ECOG PS- 0 Pain scale- 0   Review of systems- Review of Systems  Constitutional: Positive for malaise/fatigue. Negative for chills, fever and weight loss.  HENT: Negative for congestion, ear discharge and nosebleeds.   Eyes: Negative for blurred vision.  Respiratory: Negative for cough, hemoptysis, sputum production, shortness of breath and wheezing.   Cardiovascular: Negative for chest pain, palpitations, orthopnea and claudication.  Gastrointestinal: Negative for abdominal pain, blood in stool, constipation, diarrhea, heartburn, melena, nausea and vomiting.  Genitourinary: Negative for dysuria, flank pain, frequency, hematuria and urgency.    Musculoskeletal: Negative for back pain, joint pain and myalgias.  Skin: Negative for rash.  Neurological: Negative for dizziness, tingling, focal weakness, seizures, weakness and headaches.  Endo/Heme/Allergies: Does not bruise/bleed easily.  Psychiatric/Behavioral: Negative for depression and suicidal ideas. The patient does not have insomnia.       Allergies  Allergen Reactions  . Actifed Cold-Allergy [Chlorpheniramine-Phenylephrine] Palpitations  . Septra [Sulfamethoxazole-Trimethoprim] Rash  . Sulfa Antibiotics Rash     Past Medical History:  Diagnosis Date  . Breast cancer ([place]) 2014   left breast with rad tx  . Cancer [facility]) 2014   Left Breast CA with Lumpectomy and 36 Rad tx's.  Marland Kitchen Hypertension      Past Surgical History:  Procedure Laterality Date  . BREAST BIOPSY Left 06/06/2013   positive    Social History   Socioeconomic History  . Marital status: Widowed    Spouse name: Not on file  . Number of children: Not on file  . Years of education: Not on file  . Highest education level: Not on file  Social Needs  . Financial resource strain: Not on file  . Food insecurity - worry: Not on file  . Food insecurity - inability: Not on file  . Transportation needs - medical: Not on file  . Transportation needs - non-medical: Not on file  Occupational History  . Not on file  Tobacco Use  . Smoking status: Never Smoker  . Smokeless tobacco: Never Used  Substance and Sexual Activity  . Alcohol use: Not on  file  . Drug use: Not on file  . Sexual activity: Not on file  Other Topics Concern  . Not on file  Social History Narrative  . Not on file    Family History  Problem Relation Age of Onset  . Breast cancer Maternal Aunt        60's  . Cancer Maternal Aunt   . Mitral valve prolapse Mother   . Cancer Father   . AAA (abdominal aortic aneurysm) Father   . Rheumatic fever Sister      Current Outpatient Medications:  .  acetaminophen (TYLENOL) 500 MG  tablet, Take 500 mg by mouth every 6 (six) hours as needed., Disp: , Rfl:  .  anastrozole (ARIMIDEX) 1 MG tablet, TAKE 1 TABLET DAILY, Disp: 90 tablet, Rfl: 1 .  aspirin 325 MG tablet, Take 325 mg by mouth daily., Disp: , Rfl:  .  atorvastatin (LIPITOR) 40 MG tablet, Take 40 mg by mouth daily at 6 PM. , Disp: , Rfl:  .  ferrous sulfate 325 (65 FE) MG EC tablet, Take 325 mg by mouth 2 (two) times daily., Disp: , Rfl:  .  hydrochlorothiazide (HYDRODIURIL) 25 MG tablet, Take 25 mg by mouth as needed. , Disp: , Rfl:  .  Melatonin 5 MG CAPS, Take by mouth., Disp: , Rfl:  .  metoprolol (LOPRESSOR) 50 MG tablet, Take 50 mg by mouth daily. , Disp: , Rfl:  .  Multiple Vitamin (MULTI-VITAMINS) TABS, Take 1 tablet by mouth daily. , Disp: , Rfl:  .  Omega-3 Fatty Acids (FISH OIL PO), Take 1,000 mg by mouth daily., Disp: , Rfl:  .  pantoprazole (PROTONIX) 40 MG tablet, Take 40 mg by mouth 2 (two) times daily. , Disp: , Rfl:  .  traMADol (ULTRAM) 50 MG tablet, Take by mouth every 6 (six) hours as needed., Disp: , Rfl:  .  venlafaxine XR (EFFEXOR-XR) 150 MG 24 hr capsule, Take 150 mg by mouth daily with breakfast. , Disp: , Rfl:  .  XARELTO 20 MG TABS tablet, Take 20 mg by mouth daily., Disp: , Rfl:   Physical exam:  Vitals:   09/15/17 1153  BP: 110/79  Pulse: 65  Resp: 16  Temp: 97.8 F (36.6 C)  TempSrc: Tympanic  Weight: 238 lb (108 kg)   Physical Exam  Constitutional: She is oriented to person, place, and time and well-developed, well-nourished, and in no distress.  HENT:  Head: Normocephalic and atraumatic.  Eyes: EOM are normal. Pupils are equal, round, and reactive to light.  Neck: Normal range of motion.  Cardiovascular: Normal rate, regular rhythm and normal heart sounds.  Pulmonary/Chest: Effort normal and breath sounds normal.  Abdominal: Soft. Bowel sounds are normal.  Neurological: She is alert and oriented to person, place, and time.  Skin: Skin is warm and dry.     CMP Latest  Ref Rng & Units 01/06/2017  Glucose 65 - 99 mg/dL 86  BUN 6 - 20 mg/dL 23(H)  Creatinine 0.44 - 1.00 mg/dL 1.25(H)  Sodium 135 - 145 mmol/L 139  Potassium 3.5 - 5.1 mmol/L 4.5  Chloride 101 - 111 mmol/L 103  CO2 22 - 32 mmol/L 26  Calcium 8.9 - 10.3 mg/dL 10.0  Total Protein 6.5 - 8.1 g/dL -  Total Bilirubin 0.3 - 1.2 mg/dL -  Alkaline Phos 38 - 126 U/L -  AST 15 - 41 U/L -  ALT 14 - 54 U/L -   CBC Latest Ref Rng &  Units 09/15/2017  WBC 3.6 - 11.0 K/uL 7.6  Hemoglobin 12.0 - 16.0 g/dL 13.8  Hematocrit 35.0 - 47.0 % 40.7  Platelets 150 - 440 K/uL 393      Assessment and plan- Patient is a 71 y.o. female with following issues:  1. Stage I left breast cancer- continue arimidex for 6 more months and then stop. That would complete 5 years of hormone therapy.  Recent mammogram from October 2018 revealed no evidence of malignancy.  We will order repeat mammogram at her next visit  2. H/o B/L PE- she will need to be lifelong anticoagulation as it is unclear if it was truly related to arimidex  3.  Iron deficiency anemia: Improved after taking oral iron.  Repeat CBC with ferritin and iron studies in 3 and 6 months and I will see her back in 6 months   Visit Diagnosis 1. Iron deficiency anemia, unspecified iron deficiency anemia type   2. Primary cancer of upper outer quadrant of left female breast [facility])      Dr. Randa Evens, MD, MPH [facility] Pager- [phone] 09/22/2017 2:01 PM

## 2017-10-24 DIAGNOSIS — Z7901 Long term (current) use of anticoagulants: Secondary | ICD-10-CM | POA: Insufficient documentation

## 2017-10-24 DIAGNOSIS — Z86711 Personal history of pulmonary embolism: Secondary | ICD-10-CM | POA: Insufficient documentation

## 2017-12-14 ENCOUNTER — Other Ambulatory Visit: Payer: Medicare Other

## 2017-12-20 ENCOUNTER — Inpatient Hospital Stay: Payer: Medicare Other | Attending: Oncology

## 2017-12-20 DIAGNOSIS — C50412 Malignant neoplasm of upper-outer quadrant of left female breast: Secondary | ICD-10-CM | POA: Insufficient documentation

## 2017-12-20 DIAGNOSIS — D509 Iron deficiency anemia, unspecified: Secondary | ICD-10-CM

## 2017-12-20 LAB — CBC
HEMATOCRIT: 40.3 % (ref 35.0–47.0)
Hemoglobin: 13.9 g/dL (ref 12.0–16.0)
MCH: 31.9 pg (ref 26.0–34.0)
MCHC: 34.5 g/dL (ref 32.0–36.0)
MCV: 92.7 fL (ref 80.0–100.0)
PLATELETS: 410 10*3/uL (ref 150–440)
RBC: 4.35 MIL/uL (ref 3.80–5.20)
RDW: 13.4 % (ref 11.5–14.5)
WBC: 7.3 10*3/uL (ref 3.6–11.0)

## 2017-12-20 LAB — IRON AND TIBC
IRON: 96 ug/dL (ref 28–170)
Saturation Ratios: 28 % (ref 10.4–31.8)
TIBC: 343 ug/dL (ref 250–450)
UIBC: 247 ug/dL

## 2017-12-20 LAB — FERRITIN: Ferritin: 44 ng/mL (ref 11–307)

## 2018-01-16 ENCOUNTER — Encounter: Payer: Self-pay | Admitting: *Deleted

## 2018-01-17 ENCOUNTER — Ambulatory Visit
Admission: RE | Admit: 2018-01-17 | Discharge: 2018-01-17 | Disposition: A | Discharge status: Home / Self Care | Payer: Medicare Other | Source: Ambulatory Visit | Attending: Unknown Physician Specialty | Admitting: Unknown Physician Specialty

## 2018-01-17 ENCOUNTER — Ambulatory Visit: Payer: Medicare Other | Admitting: Anesthesiology

## 2018-01-17 ENCOUNTER — Encounter
Admission: RE | Disposition: A | Discharge status: Home / Self Care | Payer: Self-pay | Source: Ambulatory Visit | Attending: Unknown Physician Specialty

## 2018-01-17 DIAGNOSIS — Q438 Other specified congenital malformations of intestine: Secondary | ICD-10-CM | POA: Insufficient documentation

## 2018-01-17 DIAGNOSIS — M1711 Unilateral primary osteoarthritis, right knee: Secondary | ICD-10-CM | POA: Insufficient documentation

## 2018-01-17 DIAGNOSIS — Z888 Allergy status to other drugs, medicaments and biological substances status: Secondary | ICD-10-CM | POA: Insufficient documentation

## 2018-01-17 DIAGNOSIS — Z7901 Long term (current) use of anticoagulants: Secondary | ICD-10-CM | POA: Insufficient documentation

## 2018-01-17 DIAGNOSIS — K640 First degree hemorrhoids: Secondary | ICD-10-CM | POA: Diagnosis not present

## 2018-01-17 DIAGNOSIS — Z800 Family history of malignant neoplasm of digestive organs: Secondary | ICD-10-CM | POA: Diagnosis not present

## 2018-01-17 DIAGNOSIS — Z79899 Other long term (current) drug therapy: Secondary | ICD-10-CM | POA: Diagnosis not present

## 2018-01-17 DIAGNOSIS — E785 Hyperlipidemia, unspecified: Secondary | ICD-10-CM | POA: Insufficient documentation

## 2018-01-17 DIAGNOSIS — Z882 Allergy status to sulfonamides status: Secondary | ICD-10-CM | POA: Insufficient documentation

## 2018-01-17 DIAGNOSIS — Z8711 Personal history of peptic ulcer disease: Secondary | ICD-10-CM | POA: Insufficient documentation

## 2018-01-17 DIAGNOSIS — I739 Peripheral vascular disease, unspecified: Secondary | ICD-10-CM | POA: Insufficient documentation

## 2018-01-17 DIAGNOSIS — D509 Iron deficiency anemia, unspecified: Secondary | ICD-10-CM | POA: Diagnosis not present

## 2018-01-17 DIAGNOSIS — K449 Diaphragmatic hernia without obstruction or gangrene: Secondary | ICD-10-CM | POA: Insufficient documentation

## 2018-01-17 DIAGNOSIS — K3189 Other diseases of stomach and duodenum: Secondary | ICD-10-CM | POA: Insufficient documentation

## 2018-01-17 DIAGNOSIS — Z86711 Personal history of pulmonary embolism: Secondary | ICD-10-CM | POA: Diagnosis not present

## 2018-01-17 DIAGNOSIS — I10 Essential (primary) hypertension: Secondary | ICD-10-CM | POA: Diagnosis not present

## 2018-01-17 DIAGNOSIS — Z923 Personal history of irradiation: Secondary | ICD-10-CM | POA: Insufficient documentation

## 2018-01-17 DIAGNOSIS — K219 Gastro-esophageal reflux disease without esophagitis: Secondary | ICD-10-CM | POA: Insufficient documentation

## 2018-01-17 DIAGNOSIS — G4730 Sleep apnea, unspecified: Secondary | ICD-10-CM | POA: Diagnosis not present

## 2018-01-17 DIAGNOSIS — K5520 Angiodysplasia of colon without hemorrhage: Secondary | ICD-10-CM | POA: Diagnosis not present

## 2018-01-17 DIAGNOSIS — Z1211 Encounter for screening for malignant neoplasm of colon: Secondary | ICD-10-CM | POA: Insufficient documentation

## 2018-01-17 DIAGNOSIS — Z9989 Dependence on other enabling machines and devices: Secondary | ICD-10-CM | POA: Diagnosis not present

## 2018-01-17 DIAGNOSIS — Z853 Personal history of malignant neoplasm of breast: Secondary | ICD-10-CM | POA: Diagnosis not present

## 2018-01-17 DIAGNOSIS — Z6841 Body Mass Index (BMI) 40.0 and over, adult: Secondary | ICD-10-CM | POA: Diagnosis not present

## 2018-01-17 HISTORY — DX: Major depressive disorder, single episode, unspecified: F32.9

## 2018-01-17 HISTORY — DX: Obesity, unspecified: E66.9

## 2018-01-17 HISTORY — DX: Dyspnea, unspecified: R06.00

## 2018-01-17 HISTORY — DX: Depression, unspecified: F32.A

## 2018-01-17 HISTORY — DX: Unspecified osteoarthritis, unspecified site: M19.90

## 2018-01-17 HISTORY — DX: Hyperlipidemia, unspecified: E78.5

## 2018-01-17 HISTORY — DX: Anemia, unspecified: D64.9

## 2018-01-17 HISTORY — DX: Other allergy status, other than to drugs and biological substances: Z91.09

## 2018-01-17 HISTORY — PX: ESOPHAGOGASTRODUODENOSCOPY (EGD) WITH PROPOFOL: SHX5813

## 2018-01-17 HISTORY — PX: COLONOSCOPY WITH PROPOFOL: SHX5780

## 2018-01-17 HISTORY — DX: Sleep apnea, unspecified: G47.30

## 2018-01-17 HISTORY — DX: Personal history of other diseases of the digestive system: Z87.19

## 2018-01-17 HISTORY — DX: Peptic ulcer, site unspecified, unspecified as acute or chronic, without hemorrhage or perforation: K27.9

## 2018-01-17 HISTORY — DX: Varicose veins of right lower extremity with inflammation: I83.11

## 2018-01-17 HISTORY — DX: Personal history of pulmonary embolism: Z86.711

## 2018-01-17 HISTORY — DX: Varicose veins of left lower extremity with inflammation: I83.12

## 2018-01-17 SURGERY — ESOPHAGOGASTRODUODENOSCOPY (EGD) WITH PROPOFOL
Anesthesia: General

## 2018-01-17 MED ORDER — PIPERACILLIN-TAZOBACTAM 3.375 G IVPB 30 MIN
3.3750 g | Freq: Once | INTRAVENOUS | Status: AC
  Administered 2018-01-17: 3.375 g via INTRAVENOUS
  Filled 2018-01-17: qty 50

## 2018-01-17 MED ORDER — SODIUM CHLORIDE 0.9 % IV SOLN
INTRAVENOUS | Status: DC
  Administered 2018-01-17: 1000 mL via INTRAVENOUS
  Administered 2018-01-17: 08:00:00 via INTRAVENOUS

## 2018-01-17 MED ORDER — SODIUM CHLORIDE 0.9 % IV SOLN: INTRAVENOUS | Status: DC

## 2018-01-17 MED ORDER — FENTANYL CITRATE (PF) 100 MCG/2ML IJ SOLN
INTRAMUSCULAR | Status: DC | PRN
  Administered 2018-01-17 (×2): 50 ug via INTRAVENOUS

## 2018-01-17 MED ORDER — PROPOFOL 10 MG/ML IV BOLUS
INTRAVENOUS | Status: AC
  Filled 2018-01-17: qty 20

## 2018-01-17 MED ORDER — FENTANYL CITRATE (PF) 100 MCG/2ML IJ SOLN
INTRAMUSCULAR | Status: AC
  Filled 2018-01-17: qty 2

## 2018-01-17 MED ORDER — PHENYLEPHRINE HCL 10 MG/ML IJ SOLN
INTRAMUSCULAR | Status: DC | PRN
  Administered 2018-01-17 (×2): 100 ug via INTRAVENOUS

## 2018-01-17 MED ORDER — PROPOFOL 500 MG/50ML IV EMUL
INTRAVENOUS | Status: AC
  Filled 2018-01-17: qty 50

## 2018-01-17 MED ORDER — PIPERACILLIN-TAZOBACTAM 3.375 G IVPB
INTRAVENOUS | Status: AC
  Filled 2018-01-17: qty 50

## 2018-01-17 MED ORDER — PROPOFOL 10 MG/ML IV BOLUS
INTRAVENOUS | Status: DC | PRN
  Administered 2018-01-17: 80 mg via INTRAVENOUS

## 2018-01-17 MED ORDER — LIDOCAINE 2% (20 MG/ML) 5 ML SYRINGE
INTRAMUSCULAR | Status: DC | PRN
  Administered 2018-01-17: 30 mg via INTRAVENOUS

## 2018-01-17 MED ORDER — PROPOFOL 500 MG/50ML IV EMUL
INTRAVENOUS | Status: DC | PRN
Start: 2018-01-17 — End: 2018-01-17
  Administered 2018-01-17: 160 ug/kg/min via INTRAVENOUS

## 2018-01-17 MED ORDER — LIDOCAINE HCL (PF) 2 % IJ SOLN
INTRAMUSCULAR | Status: AC
  Filled 2018-01-17: qty 10

## 2018-01-17 NOTE — Anesthesia Preprocedure Evaluation (Signed)
Anesthesia Evaluation  Patient identified by MRN, date of birth, ID band Patient awake    Reviewed: Allergy & Precautions, H&P , NPO status , Patient's Chart, lab work & pertinent test results, reviewed documented beta blocker date and time   Airway Mallampati: II   Neck ROM: full    Dental  (+) Poor Dentition, Teeth Intact   Pulmonary neg pulmonary ROS, shortness of breath and with exertion, sleep apnea and Continuous Positive Airway Pressure Ventilation ,    Pulmonary exam normal        Cardiovascular Exercise Tolerance: Poor hypertension, On Medications + Peripheral Vascular Disease  negative cardio ROS Normal cardiovascular exam Rhythm:regular Rate:Normal     Neuro/Psych PSYCHIATRIC DISORDERS Depression  Neuromuscular disease negative neurological ROS  negative psych ROS   GI/Hepatic negative GI ROS, Neg liver ROS, hiatal hernia, PUD,   Endo/Other  negative endocrine ROSMorbid obesity  Renal/GU negative Renal ROS  negative genitourinary   Musculoskeletal   Abdominal   Peds  Hematology negative hematology ROS (+) anemia ,   Anesthesia Other Findings Past Medical History: No date: Anemia No date: Arthritis     Comment:  osteoarthritis,rt.knee 2014: Breast cancer ([place])     Comment:  left breast with rad tx 2014: Cancer ([place])     Comment:  Left Breast CA with Lumpectomy and 36 Rad tx's. No date: Depression No date: Dyspnea     Comment:  exertional dyspnea No date: Environmental allergies No date: History of hiatal hernia No date: Hx of pulmonary embolus No date: Hyperlipidemia No date: Hypertension No date: Obesity No date: PUD (peptic ulcer disease) No date: Sleep apnea     Comment:  on C+PAP No date: Varicose veins of both lower extremities with inflammation Past Surgical History: No date: ABDOMINAL HYSTERECTOMY 06/06/2013: BREAST BIOPSY; Left     Comment:  positive No date: HERNIA REPAIR No date:  IR SCLEROTHERAPY OF A FLUID COLLECTION 12/2008: JOINT REPLACEMENT; Left No date: VASCULAR SURGERY; Right     Comment:  VEIN STRIPPING BMI    Body Mass Index:  41.37 kg/m     Reproductive/Obstetrics negative OB ROS                             Anesthesia Physical Anesthesia Plan  ASA: III  Anesthesia Plan: General   Post-op Pain Management:    Induction:   PONV Risk Score and Plan:   Airway Management Planned:   Additional Equipment:   Intra-op Plan:   Post-operative Plan:   Informed Consent: I have reviewed the patients History and Physical, chart, labs and discussed the procedure including the risks, benefits and alternatives for the proposed anesthesia with the patient or authorized representative who has indicated his/her understanding and acceptance.   Dental Advisory Given  Plan Discussed with: CRNA  Anesthesia Plan Comments:         Anesthesia Quick Evaluation

## 2018-01-17 NOTE — Op Note (Signed)
[facility] Gastroenterology Patient Name: Beth Hoover Procedure Date: 01/17/2018 8:31 AM MRN: [id number] Account #: [id number] Date of Birth: October 06, 1945 Admit Type: Outpatient Age: 72 Room: [facility] ENDO ROOM 3 Gender: Female Note Status: Finalized Procedure:            Upper GI endoscopy Indications:          Unexplained iron deficiency anemia, Heartburn,                        Follow-up of gastro-esophageal reflux disease Providers:            Manya Silvas, MD Referring MD:         Leonie Douglas. Doy Hutching, MD (Referring MD) Medicines:            Propofol per Anesthesia Complications:        No immediate complications. Procedure:            Pre-Anesthesia Assessment:                       - After reviewing the risks and benefits, the patient                        was deemed in satisfactory condition to undergo the                        procedure.                       After obtaining informed consent, the endoscope was                        passed under direct vision. Throughout the procedure,                        the patient's blood pressure, pulse, and oxygen                        saturations were monitored continuously. The Endoscope                        was introduced through the mouth, and advanced to the                        second part of duodenum. The upper GI endoscopy was                        accomplished without difficulty. The patient tolerated                        the procedure well. Findings:      The examined esophagus was normal. GEJ at 32cm.      A small-medium-sized hiatal hernia was present. 3 cm in length.      Diffuse mild inflammation characterized by erythema and granularity was       found in the gastric body and in the gastric antrum. Biopsies were taken       with a cold forceps for histology. Biopsies were taken with a cold       forceps for Helicobacter pylori testing.      The examined duodenum was normal. Impression:            -  Normal esophagus.                       - Medium-sized hiatal hernia.                       - Gastritis. Biopsied.                       - Normal examined duodenum. Recommendation:       - Await pathology results. Manya Silvas, MD 01/17/2018 8:49:46 AM This report has been signed electronically. Number of Addenda: 0 Note Initiated On: 01/17/2018 8:31 AM      [facility]

## 2018-01-17 NOTE — Op Note (Signed)
[facility] Gastroenterology Patient Name: Beth Hoover Procedure Date: 01/17/2018 8:30 AM MRN: [id number] Account #: [id number] Date of Birth: February 26, 1946 Admit Type: Outpatient Age: 72 Room: [facility] ENDO ROOM 3 Gender: Female Note Status: Finalized Procedure:            Colonoscopy Indications:          Screening in patient at increased risk: Family history                        of 1st-degree relative with colorectal cancer Providers:            Manya Silvas, MD Referring MD:         Leonie Douglas. Doy Hutching, MD (Referring MD) Medicines:            Propofol per Anesthesia Complications:        No immediate complications. Procedure:            Pre-Anesthesia Assessment:                       - After reviewing the risks and benefits, the patient                        was deemed in satisfactory condition to undergo the                        procedure.                       After obtaining informed consent, the colonoscope was                        passed under direct vision. Throughout the procedure,                        the patient's blood pressure, pulse, and oxygen                        saturations were monitored continuously. The                        Colonoscope was introduced through the anus and                        advanced to the the cecum, identified by appendiceal                        orifice and ileocecal valve. The colonoscopy was                        somewhat difficult due to restricted mobility of the                        colon, a redundant colon and a tortuous colon.                        Successful completion of the procedure was aided by                        applying abdominal pressure. The patient tolerated the  procedure well. The quality of the bowel preparation                        was good. Findings:      Internal hemorrhoids were found during endoscopy. The hemorrhoids were       small and Grade I  (internal hemorrhoids that do not prolapse).      The colon was somewhat difficult with sharp looping of the colon       requiring abd pressure to pass to the cecum. Multiple telangectisas were       seen in the right colon, likely due to pressure causing some distention       and formation of tiny spots of blood but no active bleeding anywhere.       This may be the cause of her anemia. Some irritation seen in rectum Impression:           - Internal hemorrhoids.                       - No specimens collected. Recommendation:       - The findings and recommendations were discussed with                        the patient's family. Manya Silvas, MD 01/17/2018 9:24:13 AM This report has been signed electronically. Number of Addenda: 0 Note Initiated On: 01/17/2018 8:30 AM Scope Withdrawal Time: 0 hours 11 minutes 18 seconds  Total Procedure Duration: 0 hours 25 minutes 50 seconds       [facility]

## 2018-01-17 NOTE — Transfer of Care (Signed)
Immediate Anesthesia Transfer of Care Note  Patient: Beth Hoover  Procedure(s) Performed: ESOPHAGOGASTRODUODENOSCOPY (EGD) WITH PROPOFOL (N/A ) COLONOSCOPY WITH PROPOFOL (N/A )  Patient Location: PACU and Endoscopy Unit  Anesthesia Type:General  Level of Consciousness: sedated  Airway & Oxygen Therapy: Patient Spontanous Breathing and Patient connected to nasal cannula oxygen  Post-op Assessment: Report given to RN and Post -op Vital signs reviewed and stable  Post vital signs: Reviewed and stable  Last Vitals:  Vitals Value Taken Time  BP    Temp    Pulse    Resp    SpO2      Last Pain:  Vitals:   01/17/18 0757  TempSrc: Tympanic  PainSc: 0-No pain         Complications: No apparent anesthesia complications

## 2018-01-17 NOTE — H&P (Signed)
Primary Care Physician:  Idelle Crouch, MD Primary Gastroenterologist:  Dr. Vira Agar  Pre-Procedure History & Physical: HPI:  Beth Hoover is a 72 y.o. female is here for an endoscopy and colonoscopy.  Done for FH colon cancer in her father and iron def anemia   Past Medical History:  Diagnosis Date  . Anemia   . Arthritis    osteoarthritis,rt.knee  . Breast cancer ([place]) 2014   left breast with rad tx  . Cancer [facility]) 2014   Left Breast CA with Lumpectomy and 36 Rad tx's.  . Depression   . Dyspnea    exertional dyspnea  . Environmental allergies   . History of hiatal hernia   . Hx of pulmonary embolus   . Hyperlipidemia   . Hypertension   . Obesity   . PUD (peptic ulcer disease)   . Sleep apnea    on C+PAP  . Varicose veins of both lower extremities with inflammation     Past Surgical History:  Procedure Laterality Date  . ABDOMINAL HYSTERECTOMY    . BREAST BIOPSY Left 06/06/2013   positive  . HERNIA REPAIR    . IR SCLEROTHERAPY OF A FLUID COLLECTION    . JOINT REPLACEMENT Left 12/2008  . VASCULAR SURGERY Right    VEIN STRIPPING    Prior to Admission medications   Medication Sig Start Date End Date Taking? Authorizing Provider  benzonatate (TESSALON) 200 MG capsule Take 200 mg by mouth 3 (three) times daily as needed for cough.   Yes [provider]  metoprolol (LOPRESSOR) 50 MG tablet Take 50 mg by mouth daily.  11/18/14  Yes [provider]  acetaminophen (TYLENOL) 500 MG tablet Take 500 mg by mouth every 6 (six) hours as needed.    [provider]  anastrozole (ARIMIDEX) 1 MG tablet TAKE 1 TABLET DAILY 07/19/17   Lloyd Huger, MD  atorvastatin (LIPITOR) 40 MG tablet Take 40 mg by mouth daily at 6 PM.  11/18/14   [provider]  ferrous sulfate 325 (65 FE) MG EC tablet Take 325 mg by mouth 2 (two) times daily.    [provider]  hydrochlorothiazide (HYDRODIURIL) 25 MG tablet Take 25 mg by mouth as needed.      [provider]  Melatonin 5 MG CAPS Take by mouth.    [provider]  Multiple Vitamin (MULTI-VITAMINS) TABS Take 1 tablet by mouth daily.     [provider]  pantoprazole (PROTONIX) 40 MG tablet Take 40 mg by mouth 2 (two) times daily.  11/18/14   [provider]  traMADol (ULTRAM) 50 MG tablet Take by mouth every 6 (six) hours as needed.    [provider]  venlafaxine XR (EFFEXOR-XR) 150 MG 24 hr capsule Take 150 mg by mouth daily with breakfast.  11/18/14   [provider]  XARELTO 20 MG TABS tablet Take 20 mg by mouth daily. 01/06/17   [provider]    Allergies as of 10/28/2017 - Review Complete 09/15/2017  Allergen Reaction Noted  . Actifed cold-allergy [chlorpheniramine-phenylephrine] Palpitations 02/17/2015  . Septra [sulfamethoxazole-trimethoprim] Rash 02/17/2015  . Sulfa antibiotics Rash 05/18/2015    Family History  Problem Relation Age of Onset  . Breast cancer Maternal Aunt        60's  . Cancer Maternal Aunt   . Mitral valve prolapse Mother   . Cancer Father   . AAA (abdominal aortic aneurysm) Father   . Rheumatic fever Sister  Social History   Socioeconomic History  . Marital status: Widowed    Spouse name: Not on file  . Number of children: Not on file  . Years of education: Not on file  . Highest education level: Not on file  Occupational History  . Not on file  Social Needs  . Financial resource strain: Not on file  . Food insecurity:    Worry: Not on file    Inability: Not on file  . Transportation needs:    Medical: Not on file    Non-medical: Not on file  Tobacco Use  . Smoking status: Never Smoker  . Smokeless tobacco: Never Used  Substance and Sexual Activity  . Alcohol use: Yes    Comment: OCASSIONAL  . Drug use: Never  . Sexual activity: Yes  Lifestyle  . Physical activity:    Days per week: Not on file    Minutes per session: Not on file  . Stress: Not on file   Relationships  . Social connections:    Talks on phone: Not on file    Gets together: Not on file    Attends religious service: Not on file    Active member of club or organization: Not on file    Attends meetings of clubs or organizations: Not on file    Relationship status: Not on file  . Intimate partner violence:    Fear of current or ex partner: Not on file    Emotionally abused: Not on file    Physically abused: Not on file    Forced sexual activity: Not on file  Other Topics Concern  . Not on file  Social History Narrative  . Not on file    Review of Systems: See HPI, otherwise negative ROS  Physical Exam: BP (!) 145/90   Pulse 86   Temp 97.6 F (36.4 C) (Tympanic)   Resp 20   Ht 5\' 4"  (1.626 m)   Wt 109.3 kg (241 lb)   SpO2 96%   BMI 41.37 kg/m  General:   Alert,  pleasant and cooperative in NAD Head:  Normocephalic and atraumatic. Neck:  Supple; no masses or thyromegaly. Lungs:  Clear throughout to auscultation.    Heart:  Regular rate and rhythm. Abdomen:  Soft, nontender and nondistended. Normal bowel sounds, without guarding, and without rebound.   Neurologic:  Alert and  oriented x4;  grossly normal neurologically.  Impression/Plan: Beth Hoover is here for an endoscopy and colonoscopy to be performed for FH colon cancer in her father and iron def anemia.  Risks, benefits, limitations, and alternatives regarding  endoscopy and colonoscopy have been reviewed with the patient.  Questions have been answered.  All parties agreeable.   Gaylyn Cheers, MD  01/17/2018, 8:25 AM

## 2018-01-17 NOTE — Anesthesia Postprocedure Evaluation (Signed)
Anesthesia Post Note  Patient: Beth Hoover  Procedure(s) Performed: ESOPHAGOGASTRODUODENOSCOPY (EGD) WITH PROPOFOL (N/A ) COLONOSCOPY WITH PROPOFOL (N/A )  Patient location during evaluation: PACU Anesthesia Type: General Level of consciousness: awake and alert Pain management: pain level controlled Vital Signs Assessment: post-procedure vital signs reviewed and stable Respiratory status: spontaneous breathing, nonlabored ventilation, respiratory function stable and patient connected to nasal cannula oxygen Cardiovascular status: blood pressure returned to baseline and stable Postop Assessment: no apparent nausea or vomiting Anesthetic complications: no     Last Vitals:  Vitals:   01/17/18 0952 01/17/18 1002  BP: (!) 150/71 (!) 131/98  Pulse: 71 76  Resp: 16 18  Temp:    SpO2: 100% 100%    Last Pain:  Vitals:   01/17/18 1002  TempSrc:   PainSc: 0-No pain                 Molli Barrows

## 2018-01-17 NOTE — Anesthesia Post-op Follow-up Note (Signed)
Anesthesia QCDR form completed.        

## 2018-01-18 ENCOUNTER — Encounter: Payer: Self-pay | Admitting: Unknown Physician Specialty

## 2018-01-18 LAB — SURGICAL PATHOLOGY

## 2018-02-14 ENCOUNTER — Other Ambulatory Visit: Payer: Self-pay | Admitting: Oncology

## 2018-03-08 ENCOUNTER — Telehealth: Payer: Self-pay | Admitting: *Deleted

## 2018-03-08 NOTE — Telephone Encounter (Signed)
Yes pcp. Thank you.

## 2018-03-08 NOTE — Telephone Encounter (Signed)
Patient called to report that she has a rash under her right breast and at first she thought it might be heat rash, but now she doesn't,. She has not been seen in our office since January for [place]. Should she contact her PCP for this?  Please advise

## 2018-03-08 NOTE — Telephone Encounter (Signed)
Left message on patient voice mail to contact PCP regarding this

## 2018-03-20 ENCOUNTER — Other Ambulatory Visit: Payer: Medicare Other

## 2018-03-20 ENCOUNTER — Ambulatory Visit: Payer: Medicare Other | Admitting: Oncology

## 2018-03-22 ENCOUNTER — Telehealth: Payer: Self-pay

## 2018-03-22 ENCOUNTER — Other Ambulatory Visit: Payer: Self-pay

## 2018-03-22 ENCOUNTER — Encounter: Payer: Self-pay | Admitting: Oncology

## 2018-03-22 ENCOUNTER — Inpatient Hospital Stay (HOSPITAL_BASED_OUTPATIENT_CLINIC_OR_DEPARTMENT_OTHER): Payer: Medicare Other | Admitting: Oncology

## 2018-03-22 ENCOUNTER — Inpatient Hospital Stay: Payer: Medicare Other | Attending: Oncology

## 2018-03-22 VITALS — BP 117/83 | HR 85 | Temp 98.5°F | Resp 16 | Ht 64.0 in | Wt 241.6 lb

## 2018-03-22 DIAGNOSIS — Z7901 Long term (current) use of anticoagulants: Secondary | ICD-10-CM | POA: Insufficient documentation

## 2018-03-22 DIAGNOSIS — I2699 Other pulmonary embolism without acute cor pulmonale: Secondary | ICD-10-CM | POA: Insufficient documentation

## 2018-03-22 DIAGNOSIS — C50412 Malignant neoplasm of upper-outer quadrant of left female breast: Secondary | ICD-10-CM | POA: Insufficient documentation

## 2018-03-22 DIAGNOSIS — R21 Rash and other nonspecific skin eruption: Secondary | ICD-10-CM | POA: Insufficient documentation

## 2018-03-22 DIAGNOSIS — Z08 Encounter for follow-up examination after completed treatment for malignant neoplasm: Secondary | ICD-10-CM

## 2018-03-22 DIAGNOSIS — Z79811 Long term (current) use of aromatase inhibitors: Secondary | ICD-10-CM | POA: Diagnosis not present

## 2018-03-22 DIAGNOSIS — Z923 Personal history of irradiation: Secondary | ICD-10-CM

## 2018-03-22 DIAGNOSIS — B369 Superficial mycosis, unspecified: Secondary | ICD-10-CM

## 2018-03-22 DIAGNOSIS — D509 Iron deficiency anemia, unspecified: Secondary | ICD-10-CM

## 2018-03-22 DIAGNOSIS — Z170 Estrogen receptor positive status [ER+]: Secondary | ICD-10-CM

## 2018-03-22 DIAGNOSIS — Z853 Personal history of malignant neoplasm of breast: Secondary | ICD-10-CM

## 2018-03-22 LAB — CBC
HCT: 39.9 % (ref 35.0–47.0)
Hemoglobin: 13.8 g/dL (ref 12.0–16.0)
MCH: 32 pg (ref 26.0–34.0)
MCHC: 34.6 g/dL (ref 32.0–36.0)
MCV: 92.6 fL (ref 80.0–100.0)
PLATELETS: 331 10*3/uL (ref 150–440)
RBC: 4.3 MIL/uL (ref 3.80–5.20)
RDW: 13.4 % (ref 11.5–14.5)
WBC: 6.6 10*3/uL (ref 3.6–11.0)

## 2018-03-22 LAB — IRON AND TIBC
Iron: 93 ug/dL (ref 28–170)
Saturation Ratios: 28 % (ref 10.4–31.8)
TIBC: 338 ug/dL (ref 250–450)
UIBC: 245 ug/dL

## 2018-03-22 LAB — FERRITIN: FERRITIN: 43 ng/mL (ref 11–307)

## 2018-03-22 NOTE — Progress Notes (Signed)
Patient here for follow up. She reports that she has a persistent rash under her right breast. Denies dyspnea, chills and tiredness.

## 2018-03-22 NOTE — Telephone Encounter (Signed)
Left message on the patient voice mail to inform her that per Dr.rao the patient can continue [place] treatment until 09/2018.

## 2018-03-24 ENCOUNTER — Encounter: Payer: Self-pay | Admitting: Oncology

## 2018-03-25 MED ORDER — CLOTRIMAZOLE 1 % EX CREA: 1.0000 "application " | TOPICAL_CREAM | Freq: Two times a day (BID) | CUTANEOUS | 0 refills | Status: DC

## 2018-03-25 NOTE — Progress Notes (Signed)
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 Iron deficiency anemia    Chief complaint/ Reason for visit- routine f/u of breast cancer  Heme/Onc history:    Primary cancer of upper outer quadrant of left female breast ([place])   06/25/2013 Initial Diagnosis    Breast CA ([place]), left breast, T1a N0 M0, stage IA ER/PR positive HER-2 receptor-negative by [facility]      06/25/2013 Surgery    Left partial mastectomy       - 09/12/2013 Radiation Therapy    Completed radiation       - 12/11/2013 Anti-estrogen oral therapy    Discontinue letrozole due to side effect      12/11/2013 -  Anti-estrogen oral therapy    Started Arimidex      After starting arimidex, patient had b/l PE in march 2017. She was on 6 months of anticoagulation and then she stopped it. CT PE inoctober 2017 showed no PE. She again had b/l PE in April 2018 and is currently on xarelto    Interval history- reports rash and irritation under her right breast. Denies other complaints  ECOG PS- 1 Pain scale- 0   Review of systems- Review of Systems  Constitutional: Negative for chills, fever, malaise/fatigue and weight loss.  HENT: Negative for congestion, ear discharge and nosebleeds.   Eyes: Negative for blurred vision.  Respiratory: Negative for cough, hemoptysis, sputum production, shortness of breath and wheezing.   Cardiovascular: Negative for chest pain, palpitations, orthopnea and claudication.  Gastrointestinal: Negative for abdominal pain, blood in stool, constipation, diarrhea, heartburn, melena, nausea and vomiting.  Genitourinary: Negative for dysuria, flank pain, frequency, hematuria and urgency.  Musculoskeletal:  Negative for back pain, joint pain and myalgias.  Skin: Negative for rash.  Neurological: Negative for dizziness, tingling, focal weakness, seizures, weakness and headaches.  Endo/Heme/Allergies: Does not bruise/bleed easily.  Psychiatric/Behavioral: Negative for depression and suicidal ideas. The patient does not have insomnia.       Allergies  Allergen Reactions  . Actifed Cold-Allergy [Chlorpheniramine-Phenylephrine] Palpitations  . Septra [Sulfamethoxazole-Trimethoprim] Rash  . Sulfa Antibiotics Rash     Past Medical History:  Diagnosis Date  . Anemia   . Arthritis    osteoarthritis,rt.knee  . Breast cancer ([place]) 2014   left breast with rad tx  . Cancer [facility]) 2014   Left Breast CA with Lumpectomy and 36 Rad tx's.  . Depression   . Dyspnea    exertional dyspnea  . Environmental allergies   . History of hiatal hernia   . Hx of pulmonary embolus   . Hyperlipidemia   . Hypertension   . Obesity   . PUD (peptic ulcer disease)   . Sleep apnea    on C+PAP  . Varicose veins of both lower extremities with inflammation      Past Surgical History:  Procedure Laterality Date  . ABDOMINAL HYSTERECTOMY    . BREAST BIOPSY Left 06/06/2013   positive  . COLONOSCOPY WITH PROPOFOL N/A 01/17/2018   Procedure: COLONOSCOPY WITH PROPOFOL;  Surgeon: Manya Silvas, MD;  Location: [facility] ENDOSCOPY;  Service: Endoscopy;  Laterality: N/A;  . ESOPHAGOGASTRODUODENOSCOPY (EGD) WITH PROPOFOL N/A 01/17/2018   Procedure: ESOPHAGOGASTRODUODENOSCOPY (EGD) WITH PROPOFOL;  Surgeon: Vira Agar,  Gavin Pound, MD;  Location: [facility] ENDOSCOPY;  Service: Endoscopy;  Laterality: N/A;  . HERNIA REPAIR    . IR SCLEROTHERAPY OF A FLUID COLLECTION    . JOINT REPLACEMENT Left 12/2008  . VASCULAR SURGERY Right    VEIN STRIPPING    Social History   Socioeconomic History  . Marital status: Widowed    Spouse name: Not on file  . Number of children: Not on file  . Years of education: Not on file  . Highest  education level: Not on file  Occupational History  . Not on file  Social Needs  . Financial resource strain: Not on file  . Food insecurity:    Worry: Not on file    Inability: Not on file  . Transportation needs:    Medical: Not on file    Non-medical: Not on file  Tobacco Use  . Smoking status: Never Smoker  . Smokeless tobacco: Never Used  Substance and Sexual Activity  . Alcohol use: Yes    Comment: OCASSIONAL  . Drug use: Never  . Sexual activity: Yes  Lifestyle  . Physical activity:    Days per week: Not on file    Minutes per session: Not on file  . Stress: Not on file  Relationships  . Social connections:    Talks on phone: Not on file    Gets together: Not on file    Attends religious service: Not on file    Active member of club or organization: Not on file    Attends meetings of clubs or organizations: Not on file    Relationship status: Not on file  . Intimate partner violence:    Fear of current or ex partner: Not on file    Emotionally abused: Not on file    Physically abused: Not on file    Forced sexual activity: Not on file  Other Topics Concern  . Not on file  Social History Narrative  . Not on file    Family History  Problem Relation Age of Onset  . Breast cancer Maternal Aunt        60's  . Cancer Maternal Aunt   . Mitral valve prolapse Mother   . Cancer Father   . AAA (abdominal aortic aneurysm) Father   . Rheumatic fever Sister      Current Outpatient Medications:  .  acetaminophen (TYLENOL) 500 MG tablet, Take 500 mg by mouth every 6 (six) hours as needed., Disp: , Rfl:  .  anastrozole (ARIMIDEX) 1 MG tablet, TAKE 1 TABLET DAILY, Disp: 90 tablet, Rfl: 1 .  atorvastatin (LIPITOR) 40 MG tablet, Take 40 mg by mouth daily at 6 PM. , Disp: , Rfl:  .  benzonatate (TESSALON) 200 MG capsule, Take 200 mg by mouth 3 (three) times daily as needed for cough., Disp: , Rfl:  .  ferrous sulfate 325 (65 FE) MG EC tablet, Take 325 mg by mouth 2 (two)  times daily., Disp: , Rfl:  .  hydrochlorothiazide (HYDRODIURIL) 25 MG tablet, Take 25 mg by mouth as needed. , Disp: , Rfl:  .  Melatonin 5 MG CAPS, Take by mouth., Disp: , Rfl:  .  metoprolol (LOPRESSOR) 50 MG tablet, Take 50 mg by mouth daily. , Disp: , Rfl:  .  Multiple Vitamin (MULTI-VITAMINS) TABS, Take 1 tablet by mouth daily. , Disp: , Rfl:  .  pantoprazole (PROTONIX) 40 MG tablet, Take 40 mg by mouth 2 (two) times daily. , Disp: , Rfl:  .  traMADol (ULTRAM) 50 MG tablet, Take by mouth every 6 (six) hours as needed., Disp: , Rfl:  .  venlafaxine XR (EFFEXOR-XR) 150 MG 24 hr capsule, Take 150 mg by mouth daily with breakfast. , Disp: , Rfl:  .  XARELTO 20 MG TABS tablet, Take 20 mg by mouth daily., Disp: , Rfl:  .  clotrimazole (CLOTRIMAZOLE AF) 1 % cream, Apply 1 application topically 2 (two) times daily., Disp: 30 g, Rfl: 0  Physical exam:  Vitals:   03/22/18 0829 03/22/18 0834  BP:  117/83  Pulse:  85  Resp: 16   Temp:  98.5 F (36.9 C)  TempSrc:  Tympanic  Weight: 241 lb 9.6 oz (109.6 kg)   Height: _0  (1.626 m)    Physical Exam  Constitutional: She is oriented to person, place, and time. She appears well-developed and well-nourished.  HENT:  Head: Normocephalic and atraumatic.  Eyes: Pupils are equal, round, and reactive to light. EOM are normal.  Neck: Normal range of motion.  Cardiovascular: Normal rate, regular rhythm and normal heart sounds.  Pulmonary/Chest: Effort normal and breath sounds normal.  Abdominal: Soft. Bowel sounds are normal.  Neurological: She is alert and oriented to person, place, and time.  Skin: Skin is warm and dry.  Erythematous maculopapular rash seen in the inframammary fold of right breast- likely fungal    Breast exam was performed in seated and lying down position. Patient is status post left lumpectomy with a well-healed surgical scar. No evidence of any palpable masses. No evidence of axillary adenopathy. No evidence of any palpable  masses or lumps in the right breast. No evidence of right axillary adenopathy  CMP Latest Ref Rng & Units 01/06/2017  Glucose 65 - 99 mg/dL 86  BUN 6 - 20 mg/dL 23(H)  Creatinine 0.44 - 1.00 mg/dL 1.25(H)  Sodium 135 - 145 mmol/L 139  Potassium 3.5 - 5.1 mmol/L 4.5  Chloride 101 - 111 mmol/L 103  CO2 22 - 32 mmol/L 26  Calcium 8.9 - 10.3 mg/dL 10.0  Total Protein 6.5 - 8.1 g/dL -  Total Bilirubin 0.3 - 1.2 mg/dL -  Alkaline Phos 38 - 126 U/L -  AST 15 - 41 U/L -  ALT 14 - 54 U/L -   CBC Latest Ref Rng & Units 03/22/2018  WBC 3.6 - 11.0 K/uL 6.6  Hemoglobin 12.0 - 16.0 g/dL 13.8  Hematocrit 35.0 - 47.0 % 39.9  Platelets 150 - 440 K/uL 331    No images are attached to the encounter.  No results found.   Assessment and plan- Patient is a 72 y.o. female with following issues:  1. Stage I left breast acncer- she is on arimdiex and will complete 5 years in jan 2020. Will repeat mammogram in oct 2019.  2. Skin rash- likely fungal- will precribe clotrimazole cream  3. Iron deficiency anemia- she is not anemic today and iron stores are normal  4. 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 Encounter for follow-up surveillance of breast cancer   3. Iron deficiency anemia, unspecified iron deficiency anemia type   4. Fungal rash of trunk      Dr. Randa Evens, MD, MPH [facility] at [facility] [phone] 03/25/2018 10:37 AM

## 2018-07-12 ENCOUNTER — Ambulatory Visit
Admission: RE | Admit: 2018-07-12 | Discharge: 2018-07-12 | Disposition: A | Discharge status: Home / Self Care | Payer: Medicare Other | Source: Ambulatory Visit | Attending: Oncology | Admitting: Oncology

## 2018-07-12 DIAGNOSIS — C50412 Malignant neoplasm of upper-outer quadrant of left female breast: Secondary | ICD-10-CM

## 2018-09-21 ENCOUNTER — Inpatient Hospital Stay: Payer: Medicare Other

## 2018-09-21 ENCOUNTER — Inpatient Hospital Stay: Payer: Medicare Other | Attending: Oncology | Admitting: Oncology

## 2018-09-21 ENCOUNTER — Encounter: Payer: Self-pay | Admitting: Oncology

## 2018-09-21 VITALS — Wt 244.0 lb

## 2018-09-21 DIAGNOSIS — Z923 Personal history of irradiation: Secondary | ICD-10-CM | POA: Diagnosis not present

## 2018-09-21 DIAGNOSIS — C50412 Malignant neoplasm of upper-outer quadrant of left female breast: Secondary | ICD-10-CM

## 2018-09-21 DIAGNOSIS — Z86711 Personal history of pulmonary embolism: Secondary | ICD-10-CM | POA: Diagnosis not present

## 2018-09-21 DIAGNOSIS — Z08 Encounter for follow-up examination after completed treatment for malignant neoplasm: Secondary | ICD-10-CM

## 2018-09-21 DIAGNOSIS — D509 Iron deficiency anemia, unspecified: Secondary | ICD-10-CM | POA: Diagnosis not present

## 2018-09-21 DIAGNOSIS — Z853 Personal history of malignant neoplasm of breast: Secondary | ICD-10-CM

## 2018-09-21 DIAGNOSIS — Z79811 Long term (current) use of aromatase inhibitors: Secondary | ICD-10-CM

## 2018-09-21 DIAGNOSIS — Z7901 Long term (current) use of anticoagulants: Secondary | ICD-10-CM

## 2018-09-21 DIAGNOSIS — Z5181 Encounter for therapeutic drug level monitoring: Secondary | ICD-10-CM

## 2018-09-21 LAB — CBC WITH DIFFERENTIAL/PLATELET
Abs Immature Granulocytes: 0.02 10*3/uL (ref 0.00–0.07)
BASOS ABS: 0.1 10*3/uL (ref 0.0–0.1)
Basophils Relative: 1 %
EOS ABS: 0.3 10*3/uL (ref 0.0–0.5)
Eosinophils Relative: 4 %
HEMATOCRIT: 41.3 % (ref 36.0–46.0)
HEMOGLOBIN: 13.4 g/dL (ref 12.0–15.0)
Immature Granulocytes: 0 %
LYMPHS ABS: 1.7 10*3/uL (ref 0.7–4.0)
LYMPHS PCT: 23 %
MCH: 30.5 pg (ref 26.0–34.0)
MCHC: 32.4 g/dL (ref 30.0–36.0)
MCV: 93.9 fL (ref 80.0–100.0)
MONOS PCT: 9 %
Monocytes Absolute: 0.7 10*3/uL (ref 0.1–1.0)
NEUTROS ABS: 4.8 10*3/uL (ref 1.7–7.7)
NRBC: 0 % (ref 0.0–0.2)
Neutrophils Relative %: 63 %
Platelets: 386 10*3/uL (ref 150–400)
RBC: 4.4 MIL/uL (ref 3.87–5.11)
RDW: 13 % (ref 11.5–15.5)
WBC: 7.6 10*3/uL (ref 4.0–10.5)

## 2018-09-21 LAB — IRON AND TIBC
Iron: 74 ug/dL (ref 28–170)
Saturation Ratios: 19 % (ref 10.4–31.8)
TIBC: 385 ug/dL (ref 250–450)
UIBC: 311 ug/dL

## 2018-09-21 LAB — COMPREHENSIVE METABOLIC PANEL
ALBUMIN: 4.1 g/dL (ref 3.5–5.0)
ALT: 38 U/L (ref 0–44)
AST: 31 U/L (ref 15–41)
Alkaline Phosphatase: 104 U/L (ref 38–126)
Anion gap: 9 (ref 5–15)
BILIRUBIN TOTAL: 0.5 mg/dL (ref 0.3–1.2)
BUN: 23 mg/dL (ref 8–23)
CO2: 29 mmol/L (ref 22–32)
CREATININE: 1.19 mg/dL — AB (ref 0.44–1.00)
Calcium: 9.6 mg/dL (ref 8.9–10.3)
Chloride: 104 mmol/L (ref 98–111)
GFR calc Af Amer: 53 mL/min — ABNORMAL LOW (ref >60)
GFR calc non Af Amer: 46 mL/min — ABNORMAL LOW (ref >60)
GLUCOSE: 109 mg/dL — AB (ref 70–99)
POTASSIUM: 4.4 mmol/L (ref 3.5–5.1)
Sodium: 142 mmol/L (ref 135–145)
TOTAL PROTEIN: 7.3 g/dL (ref 6.5–8.1)

## 2018-09-21 LAB — FERRITIN: FERRITIN: 39 ng/mL (ref 11–307)

## 2018-09-21 NOTE — Progress Notes (Signed)
Patient states she gets to stop her Arimidex this month. Doing well over all. Having some BP issues that her PCP is working with. Appetite is great. Energy is fair.

## 2018-09-23 NOTE — Progress Notes (Signed)
Hematology/Oncology Consult note [facility]  [phone] Fax:([phone]  Patient Care Team: Idelle Crouch, MD as PCP - General (Internal Medicine)   Name of the patient: Beth Hoover  [id number]  07-Apr-1946   Date of visit: 09/23/18  Diagnosis- Stage I leftbreast cancer ER PR positive2. Iron deficiency anemia  Chief complaint/ Reason for visit-routine follow-up of breast cancer and iron deficiency anemia  Heme/Onc history:    Primary cancer of upper outer quadrant of left female breast ([place])   06/25/2013 Initial Diagnosis    Breast CA ([place]), left breast, T1a N0 M0, stage IA ER/PR positive HER-2 receptor-negative by [facility]    06/25/2013 Surgery    Left partial mastectomy     - 09/12/2013 Radiation Therapy    Completed radiation     - 12/11/2013 Anti-estrogen oral therapy    Discontinue letrozole due to side effect    12/11/2013 -  Anti-estrogen oral therapy    Started Arimidex    After starting arimidex, patient had b/l PE in march 2017. She was on 6 months of anticoagulation and then she stopped it. CT PE inoctober 2017 showed no PE. She again had b/l PE in April 2018 and is currently on xarelto   Interval history-patient is currently on Arimidex and this will be her last month marking 5 years of hormone therapy.  She is doing well and denies any fatigue changes in her appetite or unintentional weight loss.  Denies any aches or pains anywhere  ECOG PS- 1 Pain scale- 0   Review of systems- Review of Systems  Constitutional: Negative for chills, fever, malaise/fatigue and weight loss.  HENT: Negative for congestion, ear discharge and nosebleeds.   Eyes: Negative for blurred vision.  Respiratory: Negative for cough, hemoptysis, sputum production, shortness of breath and wheezing.   Cardiovascular: Negative for chest pain, palpitations, orthopnea and claudication.  Gastrointestinal: Negative for abdominal pain, blood in stool,  constipation, diarrhea, heartburn, melena, nausea and vomiting.  Genitourinary: Negative for dysuria, flank pain, frequency, hematuria and urgency.  Musculoskeletal: Negative for back pain, joint pain and myalgias.  Skin: Negative for rash.  Neurological: Negative for dizziness, tingling, focal weakness, seizures, weakness and headaches.  Endo/Heme/Allergies: Does not bruise/bleed easily.  Psychiatric/Behavioral: Negative for depression and suicidal ideas. The patient does not have insomnia.       Allergies  Allergen Reactions  . Actifed Cold-Allergy [Chlorpheniramine-Phenylephrine] Palpitations  . Septra [Sulfamethoxazole-Trimethoprim] Rash  . Sulfa Antibiotics Rash     Past Medical History:  Diagnosis Date  . Anemia   . Arthritis    osteoarthritis,rt.knee  . Breast cancer ([place]) 2014   left breast with rad tx  . Cancer [facility]) 2014   Left Breast CA with Lumpectomy and 36 Rad tx's.  . Depression   . Dyspnea    exertional dyspnea  . Environmental allergies   . History of hiatal hernia   . Hx of pulmonary embolus   . Hyperlipidemia   . Hypertension   . Obesity   . PUD (peptic ulcer disease)   . Sleep apnea    on C+PAP  . Varicose veins of both lower extremities with inflammation      Past Surgical History:  Procedure Laterality Date  . ABDOMINAL HYSTERECTOMY    . BREAST BIOPSY Left 06/06/2013   positive  . COLONOSCOPY WITH PROPOFOL N/A 01/17/2018   Procedure: COLONOSCOPY WITH PROPOFOL;  Surgeon: Manya Silvas, MD;  Location: [facility] ENDOSCOPY;  Service: Endoscopy;  Laterality:  N/A;  . ESOPHAGOGASTRODUODENOSCOPY (EGD) WITH PROPOFOL N/A 01/17/2018   Procedure: ESOPHAGOGASTRODUODENOSCOPY (EGD) WITH PROPOFOL;  Surgeon: Manya Silvas, MD;  Location[facility] ENDOSCOPY;  Service: Endoscopy;  Laterality: N/A;  . HERNIA REPAIR    . IR SCLEROTHERAPY OF A FLUID COLLECTION    . JOINT REPLACEMENT Left 12/2008  . VASCULAR SURGERY Right    VEIN STRIPPING    Social History    Socioeconomic History  . Marital status: Widowed    Spouse name: Not on file  . Number of children: Not on file  . Years of education: Not on file  . Highest education level: Not on file  Occupational History  . Not on file  Social Needs  . Financial resource strain: Not on file  . Food insecurity:    Worry: Not on file    Inability: Not on file  . Transportation needs:    Medical: Not on file    Non-medical: Not on file  Tobacco Use  . Smoking status: Never Smoker  . Smokeless tobacco: Never Used  Substance and Sexual Activity  . Alcohol use: Yes    Comment: OCASSIONAL  . Drug use: Never  . Sexual activity: Yes  Lifestyle  . Physical activity:    Days per week: Not on file    Minutes per session: Not on file  . Stress: Not on file  Relationships  . Social connections:    Talks on phone: Not on file    Gets together: Not on file    Attends religious service: Not on file    Active member of club or organization: Not on file    Attends meetings of clubs or organizations: Not on file    Relationship status: Not on file  . Intimate partner violence:    Fear of current or ex partner: Not on file    Emotionally abused: Not on file    Physically abused: Not on file    Forced sexual activity: Not on file  Other Topics Concern  . Not on file  Social History Narrative  . Not on file    Family History  Problem Relation Age of Onset  . Breast cancer Maternal Aunt        60's  . Cancer Maternal Aunt   . Mitral valve prolapse Mother   . Cancer Father   . AAA (abdominal aortic aneurysm) Father   . Rheumatic fever Sister   . Breast cancer Other        paternal half sister     Current Outpatient Medications:  .  acetaminophen (TYLENOL) 500 MG tablet, Take 500 mg by mouth every 6 (six) hours as needed., Disp: , Rfl:  .  amLODipine (NORVASC) 2.5 MG tablet, Take 1 tablet by mouth once. Take 1 tablet in evening if BP > 150/90, Disp: , Rfl:  .  anastrozole (ARIMIDEX) 1  MG tablet, TAKE 1 TABLET DAILY, Disp: 90 tablet, Rfl: 1 .  atorvastatin (LIPITOR) 40 MG tablet, Take 40 mg by mouth daily at 6 PM. , Disp: , Rfl:  .  hydrochlorothiazide (HYDRODIURIL) 25 MG tablet, Take 25 mg by mouth as needed. , Disp: , Rfl:  .  metoprolol (LOPRESSOR) 50 MG tablet, Take 50 mg by mouth 2 (two) times daily. , Disp: , Rfl:  .  Multiple Vitamin (MULTI-VITAMINS) TABS, Take 1 tablet by mouth daily. , Disp: , Rfl:  .  pantoprazole (PROTONIX) 40 MG tablet, Take 40 mg by mouth 2 (two) times daily. ,  Disp: , Rfl:  .  traZODone (DESYREL) 100 MG tablet, Take 100 mg by mouth at bedtime., Disp: , Rfl:  .  venlafaxine XR (EFFEXOR-XR) 150 MG 24 hr capsule, Take 150 mg by mouth daily with breakfast. , Disp: , Rfl:  .  XARELTO 20 MG TABS tablet, Take 20 mg by mouth daily., Disp: , Rfl:  .  benzonatate (TESSALON) 200 MG capsule, Take 200 mg by mouth 3 (three) times daily as needed for cough., Disp: , Rfl:  .  clotrimazole (CLOTRIMAZOLE AF) 1 % cream, Apply 1 application topically 2 (two) times daily. (Patient not taking: Reported on 09/21/2018), Disp: 30 g, Rfl: 0 .  traMADol (ULTRAM) 50 MG tablet, Take by mouth every 6 (six) hours as needed., Disp: , Rfl:   Physical exam:  Vitals:   09/21/18 1457  Weight: 244 lb (110.7 kg)   Physical Exam Constitutional:      General: She is not in acute distress. HENT:     Head: Normocephalic and atraumatic.  Eyes:     Pupils: Pupils are equal, round, and reactive to light.  Neck:     Musculoskeletal: Normal range of motion.  Cardiovascular:     Rate and Rhythm: Normal rate and regular rhythm.     Heart sounds: Normal heart sounds.  Pulmonary:     Effort: Pulmonary effort is normal.     Breath sounds: Normal breath sounds.  Abdominal:     General: Bowel sounds are normal. There is no distension.     Palpations: Abdomen is soft.     Tenderness: There is no abdominal tenderness.  Skin:    General: Skin is warm and dry.  Neurological:     Mental  Status: She is alert and oriented to person, place, and time.    Breast exam was performed in seated and lying down position. Patient is status post left lumpectomy with a well-healed surgical scar. No evidence of any palpable masses. No evidence of axillary adenopathy. No evidence of any palpable masses or lumps in the right breast. No evidence of right axillary adenopathy   CMP Latest Ref Rng & Units 09/21/2018  Glucose 70 - 99 mg/dL 109(H)  BUN 8 - 23 mg/dL 23  Creatinine 0.44 - 1.00 mg/dL 1.19(H)  Sodium 135 - 145 mmol/L 142  Potassium 3.5 - 5.1 mmol/L 4.4  Chloride 98 - 111 mmol/L 104  CO2 22 - 32 mmol/L 29  Calcium 8.9 - 10.3 mg/dL 9.6  Total Protein 6.5 - 8.1 g/dL 7.3  Total Bilirubin 0.3 - 1.2 mg/dL 0.5  Alkaline Phos 38 - 126 U/L 104  AST 15 - 41 U/L 31  ALT 0 - 44 U/L 38   CBC Latest Ref Rng & Units 09/21/2018  WBC 4.0 - 10.5 K/uL 7.6  Hemoglobin 12.0 - 15.0 g/dL 13.4  Hematocrit 36.0 - 46.0 % 41.3  Platelets 150 - 400 K/uL 386    No images are attached to the encounter.  No results found.   Assessment and plan- Patient is a 73 y.o. female following issues:  1.  Stage I left breast cancer: Clinically she is doing well and there is no evidence of recurrence on today's exam.  She is due for mammogram in October 2020 which will likely be coordinated by her primary care doctor.  She has completed 5 years of hormone therapy and will stop her Arimidex at this time.  2.  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  3.  Iron deficiency anemia: She is not anemic today and her hemoglobin has been stable over the last 1 year.  Repeat CBC ferritin and iron studies in 6 months in 1 year and I will see her back in 1 years time   Visit Diagnosis 1. 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 Iron deficiency anemia, unspecified iron deficiency anemia type      Dr. Randa Evens, MD, MPH [facility] at [facility] [phone] 09/23/2018 5:57 PM

## 2019-02-25 ENCOUNTER — Emergency Department: Payer: Medicare Other

## 2019-02-25 ENCOUNTER — Other Ambulatory Visit: Payer: Self-pay

## 2019-02-25 ENCOUNTER — Emergency Department
Admission: EM | Admit: 2019-02-25 | Discharge: 2019-02-25 | Disposition: A | Discharge status: Home / Self Care | Payer: Medicare Other | Attending: Emergency Medicine | Admitting: Emergency Medicine

## 2019-02-25 ENCOUNTER — Encounter: Payer: Self-pay | Admitting: Emergency Medicine

## 2019-02-25 DIAGNOSIS — Z853 Personal history of malignant neoplasm of breast: Secondary | ICD-10-CM | POA: Diagnosis not present

## 2019-02-25 DIAGNOSIS — I10 Essential (primary) hypertension: Secondary | ICD-10-CM | POA: Insufficient documentation

## 2019-02-25 DIAGNOSIS — R51 Headache: Secondary | ICD-10-CM | POA: Insufficient documentation

## 2019-02-25 DIAGNOSIS — Z8719 Personal history of other diseases of the digestive system: Secondary | ICD-10-CM | POA: Diagnosis not present

## 2019-02-25 DIAGNOSIS — Z79899 Other long term (current) drug therapy: Secondary | ICD-10-CM | POA: Insufficient documentation

## 2019-02-25 DIAGNOSIS — R1012 Left upper quadrant pain: Secondary | ICD-10-CM | POA: Diagnosis not present

## 2019-02-25 DIAGNOSIS — R112 Nausea with vomiting, unspecified: Secondary | ICD-10-CM | POA: Insufficient documentation

## 2019-02-25 DIAGNOSIS — K449 Diaphragmatic hernia without obstruction or gangrene: Secondary | ICD-10-CM

## 2019-02-25 LAB — COMPREHENSIVE METABOLIC PANEL
ALT: 37 U/L (ref 0–44)
AST: 33 U/L (ref 15–41)
Albumin: 4.1 g/dL (ref 3.5–5.0)
Alkaline Phosphatase: 98 U/L (ref 38–126)
Anion gap: 14 (ref 5–15)
BUN: 15 mg/dL (ref 8–23)
CO2: 23 mmol/L (ref 22–32)
Calcium: 9.3 mg/dL (ref 8.9–10.3)
Chloride: 104 mmol/L (ref 98–111)
Creatinine, Ser: 0.89 mg/dL (ref 0.44–1.00)
GFR calc Af Amer: >60 mL/min (ref >60)
GFR calc non Af Amer: >60 mL/min (ref >60)
Glucose, Bld: 132 mg/dL — ABNORMAL HIGH (ref 70–99)
Potassium: 3.5 mmol/L (ref 3.5–5.1)
Sodium: 141 mmol/L (ref 135–145)
Total Bilirubin: 0.3 mg/dL (ref 0.3–1.2)
Total Protein: 7.7 g/dL (ref 6.5–8.1)

## 2019-02-25 LAB — CBC
HCT: 41.6 % (ref 36.0–46.0)
Hemoglobin: 13.9 g/dL (ref 12.0–15.0)
MCH: 30.9 pg (ref 26.0–34.0)
MCHC: 33.4 g/dL (ref 30.0–36.0)
MCV: 92.4 fL (ref 80.0–100.0)
Platelets: 331 10*3/uL (ref 150–400)
RBC: 4.5 MIL/uL (ref 3.87–5.11)
RDW: 13.8 % (ref 11.5–15.5)
WBC: 8.2 10*3/uL (ref 4.0–10.5)
nRBC: 0 % (ref 0.0–0.2)

## 2019-02-25 LAB — URINALYSIS, COMPLETE (UACMP) WITH MICROSCOPIC
Bacteria, UA: NONE SEEN
Bilirubin Urine: NEGATIVE
Glucose, UA: NEGATIVE mg/dL
Hgb urine dipstick: NEGATIVE
Ketones, ur: NEGATIVE mg/dL
Leukocytes,Ua: NEGATIVE
Nitrite: NEGATIVE
Protein, ur: NEGATIVE mg/dL
Specific Gravity, Urine: 1.009 (ref 1.005–1.030)
WBC, UA: NONE SEEN WBC/hpf (ref 0–5)
pH: 7 (ref 5.0–8.0)

## 2019-02-25 LAB — LIPASE, BLOOD: Lipase: 28 U/L (ref 11–51)

## 2019-02-25 MED ORDER — SODIUM CHLORIDE 0.9% FLUSH
3.0000 mL | Freq: Once | INTRAVENOUS | Status: AC
  Administered 2019-02-25: 3 mL via INTRAVENOUS

## 2019-02-25 MED ORDER — FAMOTIDINE 20 MG PO TABS
20.0000 mg | ORAL_TABLET | Freq: Once | ORAL | Status: AC
  Administered 2019-02-25: 16:00:00 20 mg via ORAL
  Filled 2019-02-25: qty 1

## 2019-02-25 MED ORDER — SODIUM CHLORIDE 0.9 % IV BOLUS
500.0000 mL | Freq: Once | INTRAVENOUS | Status: AC
  Administered 2019-02-25: 16:00:00 500 mL via INTRAVENOUS

## 2019-02-25 MED ORDER — IOHEXOL 300 MG/ML  SOLN
100.0000 mL | Freq: Once | INTRAMUSCULAR | Status: AC | PRN
  Administered 2019-02-25: 100 mL via INTRAVENOUS

## 2019-02-25 MED ORDER — IOHEXOL 240 MG/ML SOLN
50.0000 mL | Freq: Once | INTRAMUSCULAR | Status: AC | PRN
  Administered 2019-02-25: 50 mL via ORAL

## 2019-02-25 MED ORDER — ONDANSETRON 4 MG PO TBDP: 4.0000 mg | ORAL_TABLET | Freq: Four times a day (QID) | ORAL | 0 refills | Status: DC | PRN

## 2019-02-25 MED ORDER — ONDANSETRON HCL 4 MG/2ML IJ SOLN
4.0000 mg | Freq: Once | INTRAMUSCULAR | Status: AC
  Administered 2019-02-25: 16:00:00 4 mg via INTRAVENOUS
  Filled 2019-02-25: qty 2

## 2019-02-25 MED ORDER — ALUM & MAG HYDROXIDE-SIMETH 200-200-20 MG/5ML PO SUSP
30.0000 mL | Freq: Once | ORAL | Status: AC
  Administered 2019-02-25: 30 mL via ORAL
  Filled 2019-02-25: qty 30

## 2019-02-25 NOTE — ED Provider Notes (Signed)
[facility] Emergency Department Provider Note   ____________________________________________   First MD Initiated Contact with Patient 02/25/19 1423     (approximate)  I have reviewed the triage vital signs and the nursing notes.   HISTORY  Chief Complaint Emesis    HPI Beth Hoover is a 73 y.o. female history of previous breast cancer, hiatal hernia, PE, hypertension, peptic ulcer disease   Patient reports that she is essentially just been vomiting for the last 2 days.  She got a shingles shot 3 days ago, and that evening she noticed that she just felt nauseated and she has been vomiting.  Reports she has not been able to keep much food down.  Anything she eats she vomits up.  She reports that been ongoing for 2 days straight now.  Vomited multiple times "yellow" in color.  Denies any black or bloody vomit.  Is been associated with a mild headache the last day which she attributes to probably not taking her blood pressure medicine.  No severe sudden onset of headache.  She reports the nausea is just unremitting.  Denies that there is any pain associated with it just lots of nausea.  No chest pain or trouble breathing.  Denies abdominal pain.  No loose stools or diarrhea.  Previous hiatal hernia repair  Past Medical History:  Diagnosis Date  . Anemia   . Arthritis    osteoarthritis,rt.knee  . Breast cancer ([place]) 2014   left breast with rad tx  . Cancer [facility]) 2014   Left Breast CA with Lumpectomy and 36 Rad tx's.  . Depression   . Dyspnea    exertional dyspnea  . Environmental allergies   . History of hiatal hernia   . Hx of pulmonary embolus   . Hyperlipidemia   . Hypertension   . Obesity   . PUD (peptic ulcer disease)   . Sleep apnea    on C+PAP  . Varicose veins of both lower extremities with inflammation     Patient Active Problem List   Diagnosis Date Noted  . History of DVT (deep vein thrombosis) 01/23/2017  . Varicose veins of both  lower extremities with inflammation 01/23/2017  . Chronic venous insufficiency 01/23/2017  . Recurrent pulmonary embolism ([place]) 02/02/2016  . Absolute anemia 05/18/2015  . Arthritis 05/18/2015  . Clinical depression 05/18/2015  . Allergy to environmental factors 05/18/2015  . 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 Adiposity 05/18/2015  . BP (high blood pressure) 05/18/2015  . Primary cancer of upper outer quadrant of left female breast ([place]) 02/07/2014  . Blood glucose elevated 02/07/2014  . Obstructive apnea 02/07/2014  . Derangement of medial meniscus, posterior horn 11/15/2013  . Benign essential HTN 11/15/2013  . History of artificial joint 11/15/2013    Past Surgical History:  Procedure Laterality Date  . ABDOMINAL HYSTERECTOMY    . BREAST BIOPSY Left 06/06/2013   positive  . COLONOSCOPY WITH PROPOFOL N/A 01/17/2018   Procedure: COLONOSCOPY WITH PROPOFOL;  Surgeon: Manya Silvas, MD;  Location[facility] ENDOSCOPY;  Service: Endoscopy;  Laterality: N/A;  . ESOPHAGOGASTRODUODENOSCOPY (EGD) WITH PROPOFOL N/A 01/17/2018   Procedure: ESOPHAGOGASTRODUODENOSCOPY (EGD) WITH PROPOFOL;  Surgeon: Manya Silvas, MD;  Location: [facility] ENDOSCOPY;  Service: Endoscopy;  Laterality: N/A;  . HERNIA REPAIR    . IR SCLEROTHERAPY OF A FLUID COLLECTION    . JOINT REPLACEMENT Left 12/2008  . VASCULAR SURGERY Right    VEIN STRIPPING    Prior to Admission medications  Medication Sig Start Date End Date Taking? Authorizing Provider  acetaminophen (TYLENOL) 500 MG tablet Take 500 mg by mouth every 6 (six) hours as needed.    [provider]  amLODipine (NORVASC) 2.5 MG tablet Take 1 tablet by mouth once. Take 1 tablet in evening if BP > 150/90 08/14/18 08/14/19  [provider]  anastrozole (ARIMIDEX) 1 MG tablet TAKE 1 TABLET DAILY 02/14/18   Lloyd Huger, MD  atorvastatin (LIPITOR) 40 MG tablet Take 40 mg by mouth daily at 6 PM.  11/18/14   [provider]  benzonatate  (TESSALON) 200 MG capsule Take 200 mg by mouth 3 (three) times daily as needed for cough.    [provider]  clotrimazole (CLOTRIMAZOLE AF) 1 % cream Apply 1 application topically 2 (two) times daily. Patient not taking: Reported on 09/21/2018 03/25/18   Sindy Guadeloupe, MD  hydrochlorothiazide (HYDRODIURIL) 25 MG tablet Take 25 mg by mouth as needed.     [provider]  metoprolol (LOPRESSOR) 50 MG tablet Take 50 mg by mouth 2 (two) times daily.  11/18/14   [provider]  Multiple Vitamin (MULTI-VITAMINS) TABS Take 1 tablet by mouth daily.     [provider]  ondansetron (ZOFRAN ODT) 4 MG disintegrating tablet Take 1 tablet (4 mg total) by mouth every 6 (six) hours as needed for nausea or vomiting. 02/25/19   Delman Kitten, MD  pantoprazole (PROTONIX) 40 MG tablet Take 40 mg by mouth 2 (two) times daily.  11/18/14   [provider]  traMADol (ULTRAM) 50 MG tablet Take by mouth every 6 (six) hours as needed.    [provider]  traZODone (DESYREL) 100 MG tablet Take 100 mg by mouth at bedtime.    [provider]  venlafaxine XR (EFFEXOR-XR) 150 MG 24 hr capsule Take 150 mg by mouth daily with breakfast.  11/18/14   [provider]  XARELTO 20 MG TABS tablet Take 20 mg by mouth daily. 01/06/17   [provider]    Allergies Actifed cold-allergy [chlorpheniramine-phenylephrine], Septra [sulfamethoxazole-trimethoprim], and Sulfa antibiotics  Family History  Problem Relation Age of Onset  . Breast cancer Maternal Aunt        60's  . Cancer Maternal Aunt   . Mitral valve prolapse Mother   . Cancer Father   . AAA (abdominal aortic aneurysm) Father   . Rheumatic fever Sister   . Breast cancer Other        paternal half sister    Social History Social History   Tobacco Use  . Smoking status: Never Smoker  . Smokeless tobacco: Never Used  Substance Use Topics  . Alcohol use: Yes    Comment: OCASSIONAL  . Drug use:  Never    Review of Systems Constitutional: No fever/chills no exposure to anyone known to have coronavirus Eyes: No visual changes. ENT: No sore throat. Cardiovascular: Denies chest pain. Respiratory: Denies shortness of breath. Gastrointestinal: No abdominal pain.  Reports just nausea and vomiting. Genitourinary: Negative for dysuria. Musculoskeletal: Negative for back pain. Skin: Negative for rash. Neurological: Negative for headaches, areas of focal weakness or numbness.    ____________________________________________   PHYSICAL EXAM:  VITAL SIGNS: ED Triage Vitals  Enc Vitals Group     BP 02/25/19 1400 134/75     Pulse Rate 02/25/19 1400 86     Resp 02/25/19 1400 18     Temp 02/25/19 1400 97.8 F (36.6 C)     Temp src --  SpO2 02/25/19 1400 97 %     Weight 02/25/19 1401 245 lb (111.1 kg)     Height 02/25/19 1401 5\' 4"  (1.626 m)     Head Circumference --      Peak Flow --      Pain Score 02/25/19 1401 5     Pain Loc --      Pain Edu? --      Excl. in [place]? --     Constitutional: Alert and oriented. Well appearing and in no acute distress.  She does appear little nauseated.  She is very pleasant but appears mildly ill. Eyes: Conjunctivae are normal. Head: Atraumatic. Nose: No congestion/rhinnorhea. Mouth/Throat: Mucous membranes are moist. Neck: No stridor.  Cardiovascular: Normal rate, regular rhythm. Grossly normal heart sounds.  Good peripheral circulation. Respiratory: Normal respiratory effort.  No retractions. Lungs CTAB. Gastrointestinal: Soft and nontender except maybe a little bit of discomfort in the left upper quadrant without any rebound or guarding just reports nausea. No distention. Musculoskeletal: No lower extremity tenderness nor edema. Neurologic:  Normal speech and language. No gross focal neurologic deficits are appreciated.  Skin:  Skin is warm, dry and intact. No rash noted. Psychiatric: Mood and affect are normal. Speech and behavior  are normal.  ____________________________________________   LABS (all labs ordered are listed, but only abnormal results are displayed)  Labs Reviewed  COMPREHENSIVE METABOLIC PANEL - Abnormal; Notable for the following components:      Result Value   Glucose, Bld 132 (*)    All other components within normal limits  URINALYSIS, COMPLETE (UACMP) WITH MICROSCOPIC - Abnormal; Notable for the following components:   Color, Urine YELLOW (*)    APPearance CLEAR (*)    All other components within normal limits  LIPASE, BLOOD  CBC   ____________________________________________  EKG  Reviewed entered by me at 1545 Heart rate 70 QRS 90 QTc 440 Normal sinus rhythm no evidence ischemia or ectopy ____________________________________________  RADIOLOGY  Ct Head Wo Contrast  Result Date: 02/25/2019 CLINICAL DATA:  Nausea, vomiting and headache. EXAM: CT HEAD WITHOUT CONTRAST TECHNIQUE: Contiguous axial images were obtained from the base of the skull through the vertex without intravenous contrast. COMPARISON:  Brain MRI 08/12/2009 FINDINGS: Brain: No evidence of acute infarction, hemorrhage, hydrocephalus, extra-axial collection or mass lesion/mass effect. Vascular: Calcific atherosclerotic disease of the intra cavernous carotid arteries. Skull: Normal. Negative for fracture or focal lesion. Sinuses/Orbits: No acute finding. Other: None. IMPRESSION: No acute intracranial abnormality. Electronically Signed   By: Fidela Salisbury M.D.   On: 02/25/2019 16:42   Ct Abdomen Pelvis W Contrast  Result Date: 02/25/2019 CLINICAL DATA:  Nausea, vomiting, history of hiatal hernia repair. EXAM: CT ABDOMEN AND PELVIS WITH CONTRAST TECHNIQUE: Multidetector CT imaging of the abdomen and pelvis was performed using the standard protocol following bolus administration of intravenous contrast. CONTRAST:  161mL OMNIPAQUE IOHEXOL 300 MG/ML SOLN, 58mL OMNIPAQUE IOHEXOL 240 MG/ML SOLN, additional oral enteric  contrast COMPARISON:  None. FINDINGS: Lower chest: No acute abnormality. Moderate hiatal hernia with intrathoracic position of the gastric fundus. There are postoperative findings about the diaphragmatic hiatus. Hepatobiliary: No solid liver abnormality is seen. Hepatic steatosis. No gallstones, gallbladder wall thickening, or biliary dilatation. Pancreas: Unremarkable. No pancreatic ductal dilatation or surrounding inflammatory changes. Spleen: Normal in size without significant abnormality. Adrenals/Urinary Tract: Adrenal glands are unremarkable. Kidneys are normal, without renal calculi, solid lesion, or hydronephrosis. Bladder is unremarkable. Stomach/Bowel: Stomach is within normal limits. Appendix appears normal. No evidence of bowel  wall thickening, distention, or inflammatory changes. Sigmoid diverticulosis. Vascular/Lymphatic: Aortic atherosclerosis. No enlarged abdominal or pelvic lymph nodes. Reproductive: Status post hysterectomy. Other: No abdominal wall hernia or abnormality. No abdominopelvic ascites. Musculoskeletal: No acute or significant osseous findings. IMPRESSION: 1. No acute CT findings of the abdomen or pelvis to explain nausea and vomiting. 2. Moderate hiatal hernia with intrathoracic position of the gastric fundus. There are postoperative findings about the diaphragmatic hiatus, suggesting failed hiatal hernia repair. 3. Other chronic, incidental, and postoperative findings as detailed above. Electronically Signed   By: Eddie Candle M.D.   On: 02/25/2019 16:46    Discussed CT findings with the patient.  Reviewed the return of her hiatal hernia which she reports she previously had repaired.  She will follow-up with her primary doctor.  No acute findings ____________________________________________   PROCEDURES  Procedure(s) performed: None  Procedures  Critical Care performed: No  ____________________________________________   INITIAL IMPRESSION / ASSESSMENT AND PLAN / ED  COURSE  Pertinent labs & imaging results that were available during my care of the patient were reviewed by me and considered in my medical decision making (see chart for details).   Patient presents for nausea.  Does not appear to have a lot of other symptoms associated except she reports is frequently vomiting.  Does have a slight headache associated, and given this history with minimal abdominal discomfort ordered a head CT to exclude intracranial etiology though I suspect it is unlikely.  Also order CT abdomen pelvis as the patient has a history of hiatal hernia repair now presenting with frequent vomiting without associated diarrhea fevers chills or notable pain.  No chest symptoms.  Beth Hoover was evaluated in Emergency Department on 02/25/2019 for the symptoms described in the history of present illness. She was evaluated in the context of the global COVID-19 pandemic, which necessitated consideration that the patient might be at risk for infection with the SARS-CoV-2 virus that causes COVID-19. Institutional protocols and algorithms that pertain to the evaluation of patients at risk for COVID-19 are in a state of rapid change based on information released by regulatory bodies including the CDC and federal and state organizations. These policies and algorithms were followed during the patient's care in the ED.  Symptoms do not appear consistent with coronavirus.       ----------------------------------------- 7:56 PM on 02/25/2019 -----------------------------------------  After medication hydration antiemetic patient feels much better.  Symptoms improved.  She feels well and comfortable going home.  No ongoing pain or discomfort at this time.  Will provide prescription for Zofran, she will follow-up with Dr. Doy Hutching.  Return precautions discussed.  Her son, to pick her up  Return precautions and treatment recommendations and follow-up discussed with the patient who is agreeable with the  plan.  ____________________________________________   FINAL CLINICAL IMPRESSION(S) / ED DIAGNOSES  Final diagnoses:  Non-intractable vomiting with nausea, unspecified vomiting type  Hiatal hernia        Note:  This document was prepared using Dragon voice recognition software and may include unintentional dictation errors       Delman Kitten, MD 02/25/19 1956

## 2019-02-25 NOTE — Discharge Instructions (Signed)
? ?  Please return to the emergency room right away if you are to develop a fever, severe nausea, your pain becomes severe or worsens, you are unable to keep food down, begin vomiting any dark or bloody fluid, you develop any dark or bloody stools, feel dehydrated, or other new concerns or symptoms arise. ? ?

## 2019-02-25 NOTE — ED Triage Notes (Signed)
Vomiting since yesterday. Denies abdominal pain. Denies dysuria. States had vaccine for shingles 3 days ago.

## 2019-02-25 NOTE — ED Notes (Signed)
Pt given warm blankets and informed of needing urine sample when able

## 2019-03-25 ENCOUNTER — Inpatient Hospital Stay: Payer: Medicare Other | Attending: Oncology

## 2019-03-25 ENCOUNTER — Other Ambulatory Visit: Payer: Self-pay

## 2019-03-25 DIAGNOSIS — D509 Iron deficiency anemia, unspecified: Secondary | ICD-10-CM | POA: Insufficient documentation

## 2019-03-25 DIAGNOSIS — Z853 Personal history of malignant neoplasm of breast: Secondary | ICD-10-CM | POA: Insufficient documentation

## 2019-03-25 DIAGNOSIS — Z08 Encounter for follow-up examination after completed treatment for malignant neoplasm: Secondary | ICD-10-CM

## 2019-03-25 LAB — CBC WITH DIFFERENTIAL/PLATELET
Abs Immature Granulocytes: 0.02 10*3/uL (ref 0.00–0.07)
Basophils Absolute: 0.1 10*3/uL (ref 0.0–0.1)
Basophils Relative: 1 %
Eosinophils Absolute: 0.2 10*3/uL (ref 0.0–0.5)
Eosinophils Relative: 3 %
HCT: 42.7 % (ref 36.0–46.0)
Hemoglobin: 14.1 g/dL (ref 12.0–15.0)
Immature Granulocytes: 0 %
Lymphocytes Relative: 26 %
Lymphs Abs: 1.8 10*3/uL (ref 0.7–4.0)
MCH: 30.5 pg (ref 26.0–34.0)
MCHC: 33 g/dL (ref 30.0–36.0)
MCV: 92.4 fL (ref 80.0–100.0)
Monocytes Absolute: 0.6 10*3/uL (ref 0.1–1.0)
Monocytes Relative: 9 %
Neutro Abs: 4.1 10*3/uL (ref 1.7–7.7)
Neutrophils Relative %: 61 %
Platelets: 345 10*3/uL (ref 150–400)
RBC: 4.62 MIL/uL (ref 3.87–5.11)
RDW: 13.7 % (ref 11.5–15.5)
WBC: 6.7 10*3/uL (ref 4.0–10.5)
nRBC: 0 % (ref 0.0–0.2)

## 2019-03-25 LAB — FERRITIN: Ferritin: 27 ng/mL (ref 11–307)

## 2019-03-25 LAB — IRON AND TIBC
Iron: 120 ug/dL (ref 28–170)
Saturation Ratios: 34 % — ABNORMAL HIGH (ref 10.4–31.8)
TIBC: 353 ug/dL (ref 250–450)
UIBC: 233 ug/dL

## 2019-06-05 ENCOUNTER — Other Ambulatory Visit: Payer: Self-pay | Admitting: Internal Medicine

## 2019-06-05 DIAGNOSIS — Z1231 Encounter for screening mammogram for malignant neoplasm of breast: Secondary | ICD-10-CM

## 2019-07-17 ENCOUNTER — Ambulatory Visit
Admission: RE | Admit: 2019-07-17 | Discharge: 2019-07-17 | Disposition: A | Discharge status: Home / Self Care | Payer: Medicare Other | Source: Ambulatory Visit | Attending: Internal Medicine | Admitting: Internal Medicine

## 2019-07-17 DIAGNOSIS — Z1231 Encounter for screening mammogram for malignant neoplasm of breast: Secondary | ICD-10-CM | POA: Diagnosis not present

## 2019-07-23 ENCOUNTER — Other Ambulatory Visit: Payer: Self-pay | Admitting: Internal Medicine

## 2019-07-23 DIAGNOSIS — N6320 Unspecified lump in the left breast, unspecified quadrant: Secondary | ICD-10-CM

## 2019-07-23 DIAGNOSIS — R928 Other abnormal and inconclusive findings on diagnostic imaging of breast: Secondary | ICD-10-CM

## 2019-07-26 ENCOUNTER — Ambulatory Visit
Admission: RE | Admit: 2019-07-26 | Discharge: 2019-07-26 | Disposition: A | Discharge status: Home / Self Care | Payer: Medicare Other | Source: Ambulatory Visit | Attending: Internal Medicine | Admitting: Internal Medicine

## 2019-07-26 DIAGNOSIS — N6320 Unspecified lump in the left breast, unspecified quadrant: Secondary | ICD-10-CM | POA: Diagnosis present

## 2019-07-26 DIAGNOSIS — R928 Other abnormal and inconclusive findings on diagnostic imaging of breast: Secondary | ICD-10-CM | POA: Insufficient documentation

## 2019-09-23 ENCOUNTER — Other Ambulatory Visit: Payer: Self-pay

## 2019-09-23 DIAGNOSIS — D509 Iron deficiency anemia, unspecified: Secondary | ICD-10-CM

## 2019-09-23 NOTE — Progress Notes (Signed)
Patient pre screened for office appointment, no questions or concerns today. Patient reminded of upcoming appointment time and date. 

## 2019-09-24 ENCOUNTER — Inpatient Hospital Stay: Payer: Medicare Other

## 2019-09-24 ENCOUNTER — Other Ambulatory Visit: Payer: Self-pay

## 2019-09-24 ENCOUNTER — Inpatient Hospital Stay: Payer: Medicare Other | Attending: Oncology | Admitting: Oncology

## 2019-09-24 DIAGNOSIS — Z86711 Personal history of pulmonary embolism: Secondary | ICD-10-CM | POA: Diagnosis not present

## 2019-09-24 DIAGNOSIS — Z853 Personal history of malignant neoplasm of breast: Secondary | ICD-10-CM | POA: Insufficient documentation

## 2019-09-24 DIAGNOSIS — Z7901 Long term (current) use of anticoagulants: Secondary | ICD-10-CM | POA: Insufficient documentation

## 2019-09-24 DIAGNOSIS — D509 Iron deficiency anemia, unspecified: Secondary | ICD-10-CM

## 2019-09-24 DIAGNOSIS — Z08 Encounter for follow-up examination after completed treatment for malignant neoplasm: Secondary | ICD-10-CM | POA: Diagnosis not present

## 2019-09-24 DIAGNOSIS — Z79899 Other long term (current) drug therapy: Secondary | ICD-10-CM | POA: Diagnosis not present

## 2019-09-24 DIAGNOSIS — Z923 Personal history of irradiation: Secondary | ICD-10-CM | POA: Insufficient documentation

## 2019-09-24 DIAGNOSIS — I10 Essential (primary) hypertension: Secondary | ICD-10-CM | POA: Diagnosis not present

## 2019-09-24 LAB — CBC WITH DIFFERENTIAL/PLATELET
Abs Immature Granulocytes: 0.02 10*3/uL (ref 0.00–0.07)
Basophils Absolute: 0 10*3/uL (ref 0.0–0.1)
Basophils Relative: 1 %
Eosinophils Absolute: 0.1 10*3/uL (ref 0.0–0.5)
Eosinophils Relative: 2 %
HCT: 44.6 % (ref 36.0–46.0)
Hemoglobin: 14.5 g/dL (ref 12.0–15.0)
Immature Granulocytes: 0 %
Lymphocytes Relative: 19 %
Lymphs Abs: 1.4 10*3/uL (ref 0.7–4.0)
MCH: 30.5 pg (ref 26.0–34.0)
MCHC: 32.5 g/dL (ref 30.0–36.0)
MCV: 93.7 fL (ref 80.0–100.0)
Monocytes Absolute: 0.5 10*3/uL (ref 0.1–1.0)
Monocytes Relative: 7 %
Neutro Abs: 5 10*3/uL (ref 1.7–7.7)
Neutrophils Relative %: 71 %
Platelets: 389 10*3/uL (ref 150–400)
RBC: 4.76 MIL/uL (ref 3.87–5.11)
RDW: 13.2 % (ref 11.5–15.5)
WBC: 7 10*3/uL (ref 4.0–10.5)
nRBC: 0 % (ref 0.0–0.2)

## 2019-09-24 LAB — IRON AND TIBC
Iron: 76 ug/dL (ref 28–170)
Saturation Ratios: 22 % (ref 10.4–31.8)
TIBC: 340 ug/dL (ref 250–450)
UIBC: 264 ug/dL

## 2019-09-24 LAB — FERRITIN: Ferritin: 44 ng/mL (ref 11–307)

## 2019-09-27 NOTE — Progress Notes (Signed)
I connected with Beth Hoover on 09/27/19 at  2:30 PM EST by video enabled telemedicine visit and verified that I am speaking with the correct person using two identifiers.   I discussed the limitations, risks, security and privacy concerns of performing an evaluation and management service by telemedicine and the availability of in-person appointments. I also discussed with the patient that there may be a patient responsible charge related to this service. The patient expressed understanding and agreed to proceed.  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 Iron deficiency anemia   Chief Complaint: Routine follow-up of breast cancer and iron deficiency anemia  History of present illness:  Oncology History  Primary cancer of upper outer quadrant of left female breast ([place])  06/25/2013 Initial Diagnosis   Breast CA ([place]), left breast, T1a N0 M0, stage IA ER/PR positive HER-2 receptor-negative by [facility]   06/25/2013 Surgery   Left partial mastectomy    - 09/12/2013 Radiation Therapy   Completed radiation    - 12/11/2013 Anti-estrogen oral therapy   Discontinue letrozole due to side effect   12/11/2013 -  Anti-estrogen oral therapy   Started Arimidex    After starting arimidex, patient had b/l PE in march 2017. She was on 6 months of anticoagulation and then she stopped it. CT PE inoctober 2017 showed no PE. She again had b/l PE in April 2018 and is currently on xarelto.  Patient completed Arimidex in January 2020   Interval history: Patient is doing well and denies any complaints at this time.  Her appetite is good and weight has remained stable.  Denies any bleeding in her stool or urine.   Review of Systems  Constitutional: Negative for chills, fever, malaise/fatigue and weight loss.  HENT: Negative for congestion, ear discharge and  nosebleeds.   Eyes: Negative for blurred vision.  Respiratory: Negative for cough, hemoptysis, sputum production, shortness of breath and wheezing.   Cardiovascular: Negative for chest pain, palpitations, orthopnea and claudication.  Gastrointestinal: Negative for abdominal pain, blood in stool, constipation, diarrhea, heartburn, melena, nausea and vomiting.  Genitourinary: Negative for dysuria, flank pain, frequency, hematuria and urgency.  Musculoskeletal: Negative for back pain, joint pain and myalgias.  Skin: Negative for rash.  Neurological: Negative for dizziness, tingling, focal weakness, seizures, weakness and headaches.  Endo/Heme/Allergies: Does not bruise/bleed easily.  Psychiatric/Behavioral: Negative for depression and suicidal ideas. The patient does not have insomnia.     Allergies  Allergen Reactions  . Actifed Cold-Allergy [Chlorpheniramine-Phenylephrine] Palpitations  . Septra [Sulfamethoxazole-Trimethoprim] Rash  . Sulfa Antibiotics Rash    Past Medical History:  Diagnosis Date  . Anemia   . Arthritis    osteoarthritis,rt.knee  . Breast cancer ([place]) 2014   left breast with rad tx  . Cancer [facility]) 2014   Left Breast CA with Lumpectomy and 36 Rad tx's.  . Depression   . Dyspnea    exertional dyspnea  . Environmental allergies   . History of hiatal hernia   . Hx of pulmonary embolus   . Hyperlipidemia   . Hypertension   . Obesity   . Personal history of radiation therapy 2014   Left breast  . PUD (peptic ulcer disease)   . Sleep apnea    on C+PAP  . Varicose veins of both lower extremities with inflammation     Past Surgical History:  Procedure  Laterality Date  . ABDOMINAL HYSTERECTOMY    . BREAST BIOPSY Left 06/06/2013   [facility]  . COLONOSCOPY WITH PROPOFOL N/A 01/17/2018   Procedure: COLONOSCOPY WITH PROPOFOL;  Surgeon: Manya Silvas, MD;  Location[facility] END[facility]COPY;  Service: Endoscopy;  Laterality: N/A;  . ESOPHAGOGASTRODUODENOSCOPY (EGD) WITH  PROPOFOL N/A 01/17/2018   Procedure: ESOPHAGOGASTRODUODENOSCOPY (EGD) WITH PROPOFOL;  Surgeon: Manya Silvas, MD;  Location: [facility] ENDOSCOPY;  Service: Endoscopy;  Laterality: N/A;  . HERNIA REPAIR    . IR SCLEROTHERAPY OF A FLUID COLLECTION    . JOINT REPLACEMENT Left 12/2008  . VASCULAR SURGERY Right    VEIN STRIPPING    Social History   Socioeconomic History  . Marital status: Widowed    Spouse name: Not on file  . Number of children: Not on file  . Years of education: Not on file  . Highest education level: Not on file  Occupational History  . Not on file  Tobacco Use  . Smoking status: Never Smoker  . Smokeless tobacco: Never Used  Substance and Sexual Activity  . Alcohol use: Yes    Comment: OCASSIONAL  . Drug use: Never  . Sexual activity: Yes  Other Topics Concern  . Not on file  Social History Narrative  . Not on file   Social Determinants of Health   Financial Resource Strain:   . Difficulty of Paying Living Expenses: Not on file  Food Insecurity:   . Worried About Charity fundraiser in the Last Year: Not on file  . Ran Out of Food in the Last Year: Not on file  Transportation Needs:   . Lack of Transportation (Medical): Not on file  . Lack of Transportation (Non-Medical): Not on file  Physical Activity:   . Days of Exercise per Week: Not on file  . Minutes of Exercise per Session: Not on file  Stress:   . Feeling of Stress : Not on file  Social Connections:   . Frequency of Communication with Friends and Family: Not on file  . Frequency of Social Gatherings with Friends and Family: Not on file  . Attends Religious Services: Not on file  . Active Member of Clubs or Organizations: Not on file  . Attends Archivist Meetings: Not on file  . Marital Status: Not on file  Intimate Partner Violence:   . Fear of Current or Ex-Partner: Not on file  . Emotionally Abused: Not on file  . Physically Abused: Not on file  . Sexually Abused: Not on file     Family History  Problem Relation Age of Onset  . Breast cancer Maternal Aunt        60's  . Cancer Maternal Aunt   . Mitral valve prolapse Mother   . Cancer Father   . AAA (abdominal aortic aneurysm) Father   . Rheumatic fever Sister   . Breast cancer Other        paternal half sister     Current Outpatient Medications:  .  acetaminophen (TYLENOL) 500 MG tablet, Take 500 mg by mouth every 6 (six) hours as needed., Disp: , Rfl:  .  amLODipine (NORVASC) 2.5 MG tablet, Take 1 tablet by mouth once. Take 1 tablet in evening if BP > 150/90, Disp: , Rfl:  .  anastrozole (ARIMIDEX) 1 MG tablet, TAKE 1 TABLET DAILY, Disp: 90 tablet, Rfl: 1 .  atorvastatin (LIPITOR) 40 MG tablet, Take 40 mg by mouth daily at 6 PM. , Disp: , Rfl:  .  benzonatate (TESSALON) 200 MG capsule, Take 200 mg by mouth 3 (three) times daily as needed for cough., Disp: , Rfl:  .  clotrimazole (CLOTRIMAZOLE AF) 1 % cream, Apply 1 application topically 2 (two) times daily., Disp: 30 g, Rfl: 0 .  hydrochlorothiazide (HYDRODIURIL) 25 MG tablet, Take 25 mg by mouth as needed. , Disp: , Rfl:  .  metoprolol (LOPRESSOR) 50 MG tablet, Take 50 mg by mouth 2 (two) times daily. , Disp: , Rfl:  .  Multiple Vitamin (MULTI-VITAMINS) TABS, Take 1 tablet by mouth daily. , Disp: , Rfl:  .  ondansetron (ZOFRAN ODT) 4 MG disintegrating tablet, Take 1 tablet (4 mg total) by mouth every 6 (six) hours as needed for nausea or vomiting., Disp: 20 tablet, Rfl: 0 .  pantoprazole (PROTONIX) 40 MG tablet, Take 40 mg by mouth 2 (two) times daily. , Disp: , Rfl:  .  traMADol (ULTRAM) 50 MG tablet, Take by mouth every 6 (six) hours as needed., Disp: , Rfl:  .  traZODone (DESYREL) 100 MG tablet, Take 100 mg by mouth at bedtime., Disp: , Rfl:  .  venlafaxine XR (EFFEXOR-XR) 150 MG 24 hr capsule, Take 150 mg by mouth daily with breakfast. , Disp: , Rfl:  .  XARELTO 20 MG TABS tablet, Take 20 mg by mouth daily., Disp: , Rfl:   No results found.  No  images are attached to the encounter.   CMP Latest Ref Rng & Units 02/25/2019  Glucose 70 - 99 mg/dL 132(H)  BUN 8 - 23 mg/dL 15  Creatinine 0.44 - 1.00 mg/dL 0.89  Sodium 135 - 145 mmol/L 141  Potassium 3.5 - 5.1 mmol/L 3.5  Chloride 98 - 111 mmol/L 104  CO2 22 - 32 mmol/L 23  Calcium 8.9 - 10.3 mg/dL 9.3  Total Protein 6.5 - 8.1 g/dL 7.7  Total Bilirubin 0.3 - 1.2 mg/dL 0.3  Alkaline Phos 38 - 126 U/L 98  AST 15 - 41 U/L 33  ALT 0 - 44 U/L 37   CBC Latest Ref Rng & Units 09/24/2019  WBC 4.0 - 10.5 K/uL 7.0  Hemoglobin 12.0 - 15.0 g/dL 14.5  Hematocrit 36.0 - 46.0 % 44.6  Platelets 150 - 400 K/uL 389     Observation/objective: Appears in no acute distress of a video visit today.  Breathing is nonlabored  Assessment and plan: Patient is a 74 year old female with the following issues:  1.  History of left breast cancer: She is now more than 5 years from her breast cancer and has completed 5 years of Arimidex as well.  She can continue to get yearly mammograms and breast exam with Dr. Doy Hutching.  2.  History of bilateral PE: She will stay on Xarelto indefinitely with periodic monitoring of her kidney functions which can be done by Dr. Doy Hutching  3.  Iron deficiency anemia: Patient has not required IV iron and her hemoglobin has remained stable for over 1 year.  She can continue to get her CBC checked every 6 months with her PCP.  Follow-up instructions: Patient can continue to follow-up with Dr. Doy Hutching for the above issues and does not require hematology follow-up at this time.  She can be referred to [place] in the future if questions or concerns arise  I discussed the assessment and treatment plan with the patient. The patient was provided an opportunity to ask questions and all were answered. The patient agreed with the plan and demonstrated an understanding of the instructions.  The patient was advised to call back or seek an in-person evaluation if the symptoms worsen or if the  condition fails to improve as anticipated.  Visit Diagnosis: 1. Encounter for follow-up surveillance of breast cancer   2. Iron deficiency anemia, unspecified iron deficiency anemia type   3. History of pulmonary embolism     Dr. Randa Evens, MD, MPH [facility] at [facility] Tel- [phone] 09/27/2019 12:28 PM

## 2020-04-06 ENCOUNTER — Ambulatory Visit (INDEPENDENT_AMBULATORY_CARE_PROVIDER_SITE_OTHER): Payer: Self-pay

## 2020-04-06 ENCOUNTER — Other Ambulatory Visit: Payer: Self-pay

## 2020-04-06 DIAGNOSIS — L680 Hirsutism: Secondary | ICD-10-CM

## 2020-04-06 NOTE — Progress Notes (Signed)
Pt has had [facility] with me in the past. She had estrogen driven breast cancer and had been on estrogen blockers. She is in remission and would like to do [facility] in these areas to reduce unwanted hair. jj

## 2020-06-29 ENCOUNTER — Other Ambulatory Visit: Payer: Self-pay | Admitting: Internal Medicine

## 2020-06-29 DIAGNOSIS — Z1231 Encounter for screening mammogram for malignant neoplasm of breast: Secondary | ICD-10-CM

## 2020-07-31 ENCOUNTER — Ambulatory Visit
Admission: RE | Admit: 2020-07-31 | Discharge: 2020-07-31 | Disposition: A | Discharge status: Home / Self Care | Payer: Medicare Other | Source: Ambulatory Visit | Attending: Internal Medicine | Admitting: Internal Medicine

## 2020-07-31 ENCOUNTER — Other Ambulatory Visit: Payer: Self-pay

## 2020-07-31 DIAGNOSIS — Z1231 Encounter for screening mammogram for malignant neoplasm of breast: Secondary | ICD-10-CM | POA: Insufficient documentation

## 2021-06-17 ENCOUNTER — Other Ambulatory Visit: Payer: Self-pay | Admitting: Internal Medicine

## 2021-06-17 DIAGNOSIS — Z1231 Encounter for screening mammogram for malignant neoplasm of breast: Secondary | ICD-10-CM

## 2021-08-02 ENCOUNTER — Ambulatory Visit
Admission: RE | Admit: 2021-08-02 | Discharge: 2021-08-02 | Disposition: A | Discharge status: Home / Self Care | Payer: Medicare Other | Source: Ambulatory Visit | Attending: Internal Medicine | Admitting: Internal Medicine

## 2021-08-02 ENCOUNTER — Other Ambulatory Visit: Payer: Self-pay

## 2021-08-02 DIAGNOSIS — Z1231 Encounter for screening mammogram for malignant neoplasm of breast: Secondary | ICD-10-CM | POA: Diagnosis present

## 2021-09-01 ENCOUNTER — Ambulatory Visit (INDEPENDENT_AMBULATORY_CARE_PROVIDER_SITE_OTHER): Payer: Medicare Other | Admitting: Podiatry

## 2021-09-01 ENCOUNTER — Encounter: Payer: Self-pay | Admitting: Podiatry

## 2021-09-01 ENCOUNTER — Other Ambulatory Visit: Payer: Self-pay

## 2021-09-01 ENCOUNTER — Ambulatory Visit (INDEPENDENT_AMBULATORY_CARE_PROVIDER_SITE_OTHER): Payer: Medicare Other

## 2021-09-01 ENCOUNTER — Ambulatory Visit: Payer: Medicare Other | Admitting: Podiatry

## 2021-09-01 DIAGNOSIS — M21622 Bunionette of left foot: Secondary | ICD-10-CM

## 2021-09-01 MED ORDER — KETOCONAZOLE 2 % EX CREA: 1.0000 "application " | TOPICAL_CREAM | Freq: Two times a day (BID) | CUTANEOUS | 2 refills | Status: DC

## 2021-09-01 NOTE — Progress Notes (Signed)
Subjective:  Patient ID: Beth Hoover, female    DOB: Jun 20, 1946,  MRN: [id number] HPI Chief Complaint  Patient presents with   Foot Pain    5th MPJ left - "bunionette", aching, callused, discussed surgery with Dr. Vickki Muff, but didn't recommend due to history of blood clots   Toe Pain    Hallux left - tender around the toenail   Skin Problem    Left - skin peeling   New Patient (Initial Visit)    75 y.o. female presents with the above complaint.   ROS: Denies fever chills nausea vomiting muscle aches pains calf pain back pain chest pain shortness of breath.  Past Medical History:  Diagnosis Date   Anemia    Arthritis    osteoarthritis,rt.knee   Breast cancer ([place]) 2014   left breast with rad tx   Cancer ([place]) 2014   Left Breast CA with Lumpectomy and 36 Rad tx's.   Depression    Dyspnea    exertional dyspnea   Environmental allergies    History of hiatal hernia    Hx of pulmonary embolus    Hyperlipidemia    Hypertension    Obesity    Personal history of radiation therapy 2014   Left breast   PUD (peptic ulcer disease)    Sleep apnea    on C+PAP   Varicose veins of both lower extremities with inflammation    Past Surgical History:  Procedure Laterality Date   ABDOMINAL HYSTERECTOMY     BREAST BIOPSY Left 06/06/2013   [facility]   BREAST LUMPECTOMY     COLONOSCOPY WITH PROPOFOL N/A 01/17/2018   Procedure: COLONOSCOPY WITH PROPOFOL;  Surgeon: Manya Silvas, MD;  Location: [facility] ENDOSCOPY;  Service: Endoscopy;  Laterality: N/A;   ESOPHAGOGASTRODUODENOSCOPY (EGD) WITH PROPOFOL N/A 01/17/2018   Procedure: ESOPHAGOGASTRODUODENOSCOPY (EGD) WITH PROPOFOL;  Surgeon: Manya Silvas, MD;  Location: [facility] ENDOSCOPY;  Service: Endoscopy;  Laterality: N/A;   HERNIA REPAIR     IR SCLEROTHERAPY OF A FLUID COLLECTION     JOINT REPLACEMENT Left 12/2008   VASCULAR SURGERY Right    VEIN STRIPPING    Current Outpatient Medications:    ketoconazole (NIZORAL) 2 % cream, Apply 1  application topically 2 (two) times daily., Disp: 15 g, Rfl: 2   acetaminophen (TYLENOL) 500 MG tablet, Take 500 mg by mouth every 6 (six) hours as needed., Disp: , Rfl:    atorvastatin (LIPITOR) 40 MG tablet, Take 40 mg by mouth daily at 6 PM. , Disp: , Rfl:    clotrimazole (CLOTRIMAZOLE AF) 1 % cream, Apply 1 application topically 2 (two) times daily., Disp: 30 g, Rfl: 0   hydrochlorothiazide (HYDRODIURIL) 25 MG tablet, Take 25 mg by mouth as needed. , Disp: , Rfl:    metoprolol (LOPRESSOR) 50 MG tablet, Take 50 mg by mouth 2 (two) times daily. , Disp: , Rfl:    Multiple Vitamin (MULTI-VITAMINS) TABS, Take 1 tablet by mouth daily. , Disp: , Rfl:    pantoprazole (PROTONIX) 40 MG tablet, Take 40 mg by mouth 2 (two) times daily. , Disp: , Rfl:    telmisartan (MICARDIS) 40 MG tablet, Take 40 mg by mouth daily., Disp: , Rfl:    traZODone (DESYREL) 100 MG tablet, Take 100 mg by mouth at bedtime., Disp: , Rfl:    venlafaxine XR (EFFEXOR-XR) 150 MG 24 hr capsule, Take 150 mg by mouth daily with breakfast. , Disp: , Rfl:    XARELTO 20 MG TABS tablet, Take  20 mg by mouth daily., Disp: , Rfl:   Allergies  Allergen Reactions   Actifed Cold-Allergy [Chlorpheniramine-Phenylephrine] Palpitations   Septra [Sulfamethoxazole-Trimethoprim] Rash   Sulfa Antibiotics Rash   Review of Systems Objective:  There were no vitals filed for this visit.  General: Well developed, nourished, in no acute distress, alert and oriented x3   Dermatological: Skin is warm, dry and supple bilateral. Nails x 10 are well maintained; remaining integument appears unremarkable at this time. There are no open sores, no preulcerative lesions, no rash or signs of infection present..  Ingrown hallux nail left is painful.  She also has peeling of the skin medial longitudinal arch and lateral heel which appears to be tinea pedis.  Vascular: Dorsalis Pedis artery and Posterior Tibial artery pedal pulses are 2/4 bilateral with immedate  capillary fill time. Pedal hair growth present. No varicosities and no lower extremity edema present bilateral.   Neruologic: Grossly intact via light touch bilateral. Vibratory intact via tuning fork bilateral. Protective threshold with Semmes Wienstein monofilament intact to all pedal sites bilateral. Patellar and Achilles deep tendon reflexes 2+ bilateral. No Babinski or clonus noted bilateral.   Musculoskeletal: No gross boney pedal deformities bilateral. No pain, crepitus, or limitation noted with foot and ankle range of motion bilateral. Muscular strength 5/5 in all groups tested bilateral.  Painful tailor's bunion deformity with mild erythema along the lateral aspect of the fifth metatarsophalangeal joint.  Gait: Unassisted, Nonantalgic.    Radiographs:  Radiographs taken today demonstrate an osseously mature individual.  Left foot demonstrates metatarsus adductus with tailor's bunion deformity.  No acute findings are identified.  Assessment & Plan:   Assessment: Tailor's bunion deformity left tinea pedis left ingrown nail hallux left  Plan: Discussed etiology pathology conservative surgical therapies at this point I consented her today for 1/5 metatarsal osteotomy and a matrixectomy hallux left.  She will have to stop her Xarelto and we will get clearance from her primary care provider Dr. Doy Hutching.  We discussed possible complications which may include but not limited to postop pain bleeding swelling infection recurrence need further surgery overcorrection under correction loss of digit loss of limb loss of life DVTs and pulmonary emboli.  She signed all 3 pages of the consent form and I will follow-up with her in the near future.  I wrote a prescription for ketoconazole be applied to the arch and the lateral heel twice daily follow-up with her at the time of surgery.     Andrik Sandt T. [place], [place]

## 2021-09-29 ENCOUNTER — Ambulatory Visit: Payer: Medicare Other | Admitting: Podiatry

## 2021-10-12 ENCOUNTER — Other Ambulatory Visit: Payer: Self-pay | Admitting: Orthopedic Surgery

## 2021-10-12 ENCOUNTER — Other Ambulatory Visit (HOSPITAL_COMMUNITY): Payer: Self-pay | Admitting: Orthopedic Surgery

## 2021-10-12 DIAGNOSIS — M47816 Spondylosis without myelopathy or radiculopathy, lumbar region: Secondary | ICD-10-CM

## 2021-10-22 ENCOUNTER — Ambulatory Visit
Admission: RE | Admit: 2021-10-22 | Discharge: 2021-10-22 | Disposition: A | Discharge status: Home / Self Care | Payer: Medicare Other | Source: Ambulatory Visit | Attending: Orthopedic Surgery | Admitting: Orthopedic Surgery

## 2021-10-22 DIAGNOSIS — M47816 Spondylosis without myelopathy or radiculopathy, lumbar region: Secondary | ICD-10-CM | POA: Diagnosis present

## 2021-11-24 ENCOUNTER — Ambulatory Visit (INDEPENDENT_AMBULATORY_CARE_PROVIDER_SITE_OTHER): Payer: Medicare Other | Admitting: Podiatry

## 2021-11-24 ENCOUNTER — Encounter: Payer: Self-pay | Admitting: Podiatry

## 2021-11-24 ENCOUNTER — Other Ambulatory Visit: Payer: Self-pay

## 2021-11-24 DIAGNOSIS — L600 Ingrowing nail: Secondary | ICD-10-CM

## 2021-11-24 MED ORDER — NEOMYCIN-POLYMYXIN-HC 1 % OT SOLN: OTIC | 1 refills | Status: DC

## 2021-11-24 NOTE — Progress Notes (Signed)
She presents today chief complaint of ingrown toenail tibial border of the hallux left.  States has been bothering her for quite some time and is just getting worse. ? ?Objective: Vital signs are stable alert and oriented x3 pulses are palpable.  Sharply abraded nail margin with mild erythema no cellulitis drainage or odor tibial border hallux left. ? ?Assessment: Ingrown nail border hallux left. ? ?Plan: Chemical matricectomy was performed today to the tibial border of the hallux left tolerated procedure well without complications.  Was provided with both oral and written home-going instructions for the care and soaking of the toe as well as a prescription for Corticosporin otic.  I will follow-up with her in 2 weeks. ?

## 2021-11-24 NOTE — Patient Instructions (Signed)

## 2021-12-08 ENCOUNTER — Ambulatory Visit: Payer: Medicare Other | Admitting: Podiatry

## 2021-12-22 ENCOUNTER — Ambulatory Visit (INDEPENDENT_AMBULATORY_CARE_PROVIDER_SITE_OTHER): Payer: Medicare Other | Admitting: Podiatry

## 2021-12-22 ENCOUNTER — Encounter: Payer: Self-pay | Admitting: Podiatry

## 2021-12-22 DIAGNOSIS — Z9889 Other specified postprocedural states: Secondary | ICD-10-CM

## 2021-12-22 DIAGNOSIS — L600 Ingrowing nail: Secondary | ICD-10-CM

## 2021-12-22 NOTE — Progress Notes (Signed)
She presents today for follow-up of her nail procedure to the left hallux.  States that she is leaving for [facility] tomorrow.  She states that the toe seems to be doing really well no problems with it whatsoever. ? ?Objective: Hallux left tibial border demonstrates no erythema edema cellulitis drainage or odor mild scab is noted.  She is recently had her toes painted.  No open lesions or wounds.  No purulence no malodor. ? ?Assessment: Well-healing matrixectomy. ? ?Plan: Follow-up with me on an as-needed basis ?

## 2022-01-15 ENCOUNTER — Other Ambulatory Visit: Payer: Self-pay | Admitting: Podiatry

## 2022-03-11 DIAGNOSIS — R7303 Prediabetes: Secondary | ICD-10-CM | POA: Insufficient documentation

## 2022-03-14 ENCOUNTER — Other Ambulatory Visit: Payer: Self-pay | Admitting: Internal Medicine

## 2022-03-14 DIAGNOSIS — R600 Localized edema: Secondary | ICD-10-CM

## 2022-03-25 ENCOUNTER — Ambulatory Visit
Admission: RE | Admit: 2022-03-25 | Discharge: 2022-03-25 | Disposition: A | Discharge status: Home / Self Care | Payer: Medicare Other | Source: Ambulatory Visit | Attending: Internal Medicine | Admitting: Internal Medicine

## 2022-03-25 DIAGNOSIS — R600 Localized edema: Secondary | ICD-10-CM | POA: Insufficient documentation

## 2022-04-29 ENCOUNTER — Other Ambulatory Visit (INDEPENDENT_AMBULATORY_CARE_PROVIDER_SITE_OTHER): Payer: Self-pay | Admitting: Nurse Practitioner

## 2022-04-29 DIAGNOSIS — I999 Unspecified disorder of circulatory system: Secondary | ICD-10-CM

## 2022-05-02 ENCOUNTER — Ambulatory Visit (INDEPENDENT_AMBULATORY_CARE_PROVIDER_SITE_OTHER): Payer: Medicare Other | Admitting: Vascular Surgery

## 2022-05-02 ENCOUNTER — Encounter (INDEPENDENT_AMBULATORY_CARE_PROVIDER_SITE_OTHER): Payer: Self-pay | Admitting: Vascular Surgery

## 2022-05-02 ENCOUNTER — Other Ambulatory Visit (INDEPENDENT_AMBULATORY_CARE_PROVIDER_SITE_OTHER): Payer: Self-pay | Admitting: Vascular Surgery

## 2022-05-02 ENCOUNTER — Other Ambulatory Visit (INDEPENDENT_AMBULATORY_CARE_PROVIDER_SITE_OTHER): Payer: Self-pay | Admitting: Nurse Practitioner

## 2022-05-02 ENCOUNTER — Ambulatory Visit (INDEPENDENT_AMBULATORY_CARE_PROVIDER_SITE_OTHER): Payer: Medicare Other

## 2022-05-02 VITALS — BP 115/74 | HR 83 | Resp 16 | Ht 63.0 in | Wt 244.4 lb

## 2022-05-02 DIAGNOSIS — I2699 Other pulmonary embolism without acute cor pulmonale: Secondary | ICD-10-CM

## 2022-05-02 DIAGNOSIS — I8312 Varicose veins of left lower extremity with inflammation: Secondary | ICD-10-CM

## 2022-05-02 DIAGNOSIS — I8311 Varicose veins of right lower extremity with inflammation: Secondary | ICD-10-CM

## 2022-05-02 DIAGNOSIS — I872 Venous insufficiency (chronic) (peripheral): Secondary | ICD-10-CM

## 2022-05-02 DIAGNOSIS — I999 Unspecified disorder of circulatory system: Secondary | ICD-10-CM

## 2022-05-02 DIAGNOSIS — I10 Essential (primary) hypertension: Secondary | ICD-10-CM | POA: Diagnosis not present

## 2022-05-09 ENCOUNTER — Encounter (INDEPENDENT_AMBULATORY_CARE_PROVIDER_SITE_OTHER): Payer: Self-pay | Admitting: Vascular Surgery

## 2022-05-09 NOTE — Progress Notes (Signed)
MRN : [id number]  SECRET Beth Hoover is a 76 y.o. (Feb 03, 1946) female who presents with chief complaint of legs hurt and swell.  History of Present Illness:   The patient is seen for evaluation of symptomatic varicose veins. The patient relates burning and stinging which worsened steadily throughout the course of the day, particularly with standing. The patient also notes an aching and throbbing pain over the varicosities, particularly with prolonged dependent positions. The symptoms are significantly improved with elevation.  The patient also notes that during hot weather the symptoms are greatly intensified. The patient states the pain from the varicose veins interferes with work, daily exercise, shopping and household maintenance. At this point, the symptoms are persistent and severe enough that they're having a negative impact on lifestyle and are interfering with daily activities.  There is no history of DVT, PE or superficial thrombophlebitis. There is no history of ulceration or hemorrhage. The patient denies a significant family history of varicose veins.  The patient has not worn graduated compression in the past. At the present time the patient has not been using over-the-counter analgesics. There is no history of prior surgical intervention or sclerotherapy.   Duplex ultrasound of the right venous system shows mild deep reflux with extensive superficial reflux of the right great saphenous vein.  The left venous system is without evidence of reflux.  Current Meds  Medication Sig   acetaminophen (TYLENOL) 500 MG tablet Take 500 mg by mouth every 6 (six) hours as needed.   atorvastatin (LIPITOR) 40 MG tablet Take 40 mg by mouth daily at 6 PM.    baclofen (LIORESAL) 10 MG tablet Take 10 mg by mouth 3 (three) times daily.   benzonatate (TESSALON) 200 MG capsule Take 200 mg by mouth 3 (three) times daily as needed.   clotrimazole (CLOTRIMAZOLE AF) 1 % cream Apply 1 application  topically 2 (two) times daily.   hydrochlorothiazide (HYDRODIURIL) 25 MG tablet Take 25 mg by mouth as needed.    ketoconazole (NIZORAL) 2 % cream APPLY TO AFFECTED AREAS TOPICALLY TWICE DAILY AS DIRECTED   metoprolol (LOPRESSOR) 50 MG tablet Take 50 mg by mouth 2 (two) times daily.    Multiple Vitamin (MULTI-VITAMINS) TABS Take 1 tablet by mouth daily.    NEOMYCIN-POLYMYXIN-HYDROCORTISONE (CORTISPORIN) 1 % SOLN OTIC solution Apply 1-2 drops to toe BID after soaking   pantoprazole (PROTONIX) 40 MG tablet Take 40 mg by mouth 2 (two) times daily.    telmisartan (MICARDIS) 40 MG tablet Take 40 mg by mouth daily.   traZODone (DESYREL) 100 MG tablet Take 100 mg by mouth at bedtime.   venlafaxine XR (EFFEXOR-XR) 150 MG 24 hr capsule Take 150 mg by mouth daily with breakfast.    XARELTO 20 MG TABS tablet Take 20 mg by mouth daily.    Past Medical History:  Diagnosis Date   Anemia    Arthritis    osteoarthritis,rt.knee   Breast cancer ([place]) 2014   left breast with rad tx   Cancer ([place]) 2014   Left Breast CA with Lumpectomy and 36 Rad tx's.   Depression    Dyspnea    exertional dyspnea   Environmental allergies    History of hiatal hernia    Hx of pulmonary embolus    Hyperlipidemia    Hypertension    Obesity    Personal history of radiation therapy 2014   Left breast   PUD (peptic ulcer disease)  Sleep apnea    on C+PAP   Varicose veins of both lower extremities with inflammation     Past Surgical History:  Procedure Laterality Date   ABDOMINAL HYSTERECTOMY     BREAST BIOPSY Left 06/06/2013   [facility]   BREAST LUMPECTOMY     COLONOSCOPY WITH PROPOFOL N/A 01/17/2018   Procedure: COLONOSCOPY WITH PROPOFOL;  Surgeon: Manya Silvas, MD;  Location: [facility] ENDOSCOPY;  Service: Endoscopy;  Laterality: N/A;   ESOPHAGOGASTRODUODENOSCOPY (EGD) WITH PROPOFOL N/A 01/17/2018   Procedure: ESOPHAGOGASTRODUODENOSCOPY (EGD) WITH PROPOFOL;  Surgeon: Manya Silvas, MD;  Location: [facility] ENDOSCOPY;   Service: Endoscopy;  Laterality: N/A;   HERNIA REPAIR     IR SCLEROTHERAPY OF A FLUID COLLECTION     JOINT REPLACEMENT Left 12/2008   VASCULAR SURGERY Right    VEIN STRIPPING    Social History Social History   Tobacco Use   Smoking status: Never   Smokeless tobacco: Never  Vaping Use   Vaping Use: Never used  Substance Use Topics   Alcohol use: Yes    Comment: OCASSIONAL   Drug use: Never    Family History Family History  Problem Relation Age of Onset   Breast cancer Maternal Aunt        60's   Cancer Maternal Aunt    Mitral valve prolapse Mother    Cancer Father    AAA (abdominal aortic aneurysm) Father    Rheumatic fever Sister    Breast cancer Other        paternal half sister    Allergies  Allergen Reactions   Amoxicillin-Pot Clavulanate Diarrhea   Actifed Cold-Allergy [Chlorpheniramine-Phenylephrine] Palpitations   Septra [Sulfamethoxazole-Trimethoprim] Rash   Sulfa Antibiotics Rash     REVIEW OF SYSTEMS (Negative unless checked)  Constitutional: '[]'$ Weight loss  '[]'$ Fever  '[]'$ Chills Cardiac: '[]'$ Chest pain   '[]'$ Chest pressure   '[]'$ Palpitations   '[]'$ Shortness of breath when laying flat   '[]'$ Shortness of breath with exertion. Vascular:  '[]'$ Pain in legs with walking   '[x]'$ Pain in legs at rest  '[]'$ History of DVT   '[]'$ Phlebitis   '[x]'$ Swelling in legs   '[]'$ Varicose veins   '[]'$ Non-healing ulcers Pulmonary:   '[]'$ Uses home oxygen   '[]'$ Productive cough   '[]'$ Hemoptysis   '[]'$ Wheeze  '[]'$ COPD   '[]'$ Asthma Neurologic:  '[]'$ Dizziness   '[]'$ Seizures   '[]'$ History of stroke   '[]'$ History of TIA  '[]'$ Aphasia   '[]'$ Vissual changes   '[]'$ Weakness or numbness in arm   '[]'$ Weakness or numbness in leg Musculoskeletal:   '[]'$ Joint swelling   '[]'$ Joint pain   '[]'$ Low back pain Hematologic:  '[]'$ Easy bruising  '[]'$ Easy bleeding   '[]'$ Hypercoagulable state   '[]'$ Anemic Gastrointestinal:  '[]'$ Diarrhea   '[]'$ Vomiting  '[]'$ Gastroesophageal reflux/heartburn   '[]'$ Difficulty swallowing. Genitourinary:  '[]'$ Chronic kidney disease   '[]'$ Difficult urination   '[]'$ Frequent urination   '[]'$ Blood in urine Skin:  '[]'$ Rashes   '[]'$ Ulcers  Psychological:  '[]'$ History of anxiety   '[]'$  History of major depression.  Physical Examination  Vitals:   05/02/22 1623  BP: 115/74  Pulse: 83  Resp: 16  Weight: 244 lb 6.4 oz (110.9 kg)  Height: '5\' 3"'$  (1.6 m)   Body mass index is 43.29 kg/m. Gen: WD/WN, NAD Head: [place]/AT, No temporalis wasting.  Ear/Nose/Throat: Hearing grossly intact, nares w/o erythema or drainage, pinna without lesions Eyes: PER, EOMI, sclera nonicteric.  Neck: Supple, no gross masses.  No JVD.  Pulmonary:  Good air movement, no audible wheezing, no use of accessory muscles.  Cardiac: RRR, precordium not hyperdynamic.  Vascular:  scattered varicosities present bilaterally.  Moderate venous stasis changes to the legs bilaterally.  2+ soft pitting edema  Vessel Right Left  Radial Palpable Palpable  Gastrointestinal: soft, non-distended. No guarding/no peritoneal signs.  Musculoskeletal: M/S 5/5 throughout.  No deformity.  Neurologic: CN 2-12 intact. Pain and light touch intact in extremities.  Symmetrical.  Speech is fluent. Motor exam as listed above. Psychiatric: Judgment intact, Mood & affect appropriate for pt's clinical situation. Dermatologic: Venous rashes no ulcers noted.  No changes consistent with cellulitis. Lymph : No lichenification or skin changes of chronic lymphedema.  CBC Lab Results  Component Value Date   WBC 7.0 09/24/2019   HGB 14.5 09/24/2019   HCT 44.6 09/24/2019   MCV 93.7 09/24/2019   PLT 389 09/24/2019    BMET    Component Value Date/Time   NA 141 02/25/2019 1436   NA 137 04/22/2014 1005   K 3.5 02/25/2019 1436   K 4.6 04/22/2014 1005   CL 104 02/25/2019 1436   CL 102 04/22/2014 1005   CO2 23 02/25/2019 1436   CO2 24 04/22/2014 1005   GLUCOSE 132 (H) 02/25/2019 1436   GLUCOSE 105 (H) 04/22/2014 1005   BUN 15 02/25/2019 1436   BUN 22 (H) 04/22/2014 1005   CREATININE 0.89 02/25/2019 1436   CREATININE  1.25 04/22/2014 1005   CALCIUM 9.3 02/25/2019 1436   CALCIUM 9.3 04/22/2014 1005   GFRNONAA >60 02/25/2019 1436   GFRNONAA 44 (L) 04/22/2014 1005   GFRAA >60 02/25/2019 1436   GFRAA 52 (L) 04/22/2014 1005   CrCl cannot be calculated (Patient's most recent lab result is older than the maximum 21 days allowed.).  COAG Lab Results  Component Value Date   INR 0.99 01/06/2017    Radiology VAS [place] LOWER EXTREMITY VENOUS REFLUX  Result Date: 05/02/2022  Lower Venous Reflux Study Patient Name:  Beth Hoover  Date of Exam:   05/02/2022 Medical Rec #: [id number]      Accession #:    [id number] Date of Birth: [birth year]      Patient Gender: F Patient Age:   82 years Exam Location:  [facility] Procedure:      VAS [place] LOWER EXTREMITY VENOUS REFLUX Referring Phys: Eulogio Ditch --------------------------------------------------------------------------------  Indications: Swelling.  Performing Technologist: Blondell Reveal RT, RDMS, RVT  Examination Guidelines: A complete evaluation includes B-mode imaging, spectral Doppler, color Doppler, and power Doppler as needed of all accessible portions of each vessel. Bilateral testing is considered an integral part of a complete examination. Limited examinations for reoccurring indications may be performed as noted. The reflux portion of the exam is performed with the patient in reverse Trendelenburg. Significant venous reflux is defined as >500 ms in the superficial venous system, and >1 second in the deep venous system.  Venous Reflux Times +--------------+---------+------+-----------+------------+--------+ RIGHT         Reflux NoRefluxReflux TimeDiameter cmsComments                         Yes                                  +--------------+---------+------+-----------+------------+--------+ CFV                     yes   >1 second                      +--------------+---------+------+-----------+------------+--------+  FV mid        no                                              +--------------+---------+------+-----------+------------+--------+ Popliteal     no                                             +--------------+---------+------+-----------+------------+--------+ GSV at [facility]    no                            1.0              +--------------+---------+------+-----------+------------+--------+ GSV prox thigh          yes    >500 ms      0.54             +--------------+---------+------+-----------+------------+--------+ GSV mid thigh           yes    >500 ms      0.41             +--------------+---------+------+-----------+------------+--------+ GSV dist thigh          yes    >500 ms      0.41             +--------------+---------+------+-----------+------------+--------+ GSV at knee             yes    >500 ms      0.39             +--------------+---------+------+-----------+------------+--------+ GSV prox calf           yes    >500 ms      0.43             +--------------+---------+------+-----------+------------+--------+ SSV prox calf no                                             +--------------+---------+------+-----------+------------+--------+  +--------------+---------+------+-----------+------------+--------+ LEFT          Reflux NoRefluxReflux TimeDiameter cmsComments                         Yes                                  +--------------+---------+------+-----------+------------+--------+ CFV           no                                             +--------------+---------+------+-----------+------------+--------+ FV mid        no                                             +--------------+---------+------+-----------+------------+--------+ Popliteal     no                                             +--------------+---------+------+-----------+------------+--------+  GSV at [facility]    no                                              +--------------+---------+------+-----------+------------+--------+ GSV prox thighno                                             +--------------+---------+------+-----------+------------+--------+ GSV at knee   no                                             +--------------+---------+------+-----------+------------+--------+ SSV prox calf no                                             +--------------+---------+------+-----------+------------+--------+  Triphasic Doppler waveforms noted in the bilateral distal tibial arteries. Summary: Bilateral: - No evidence of deep vein thrombosis seen in the lower extremities, bilaterally, from the common femoral through the popliteal veins. - No evidence of superficial venous thrombosis in the lower extremities, bilaterally.  Right: - No evidence of superficial venous reflux seen in the right short saphenous vein. - Venous reflux is noted in the right common femoral vein. - Venous reflux is noted in the right greater saphenous vein in the thigh. - Venous reflux is noted in the right greater saphenous vein in the calf.  Left: - There is no evidence of venous reflux seen in the left lower extremity.  *See table(s) above for measurements and observations. Electronically signed by Hortencia Pilar MD on 05/02/2022 at 6:04:25 PM.    Final      Assessment/Plan 1. Peripheral vascular complication See #1 - VAS [place] LOWER EXTREMITY VENOUS REFLUX  2. Varicose veins of both lower extremities with inflammation  Recommend:  The patient has large symptomatic varicose veins that are painful and associated with swelling.  I have had a long discussion with the patient regarding  varicose veins and why they cause symptoms.  Patient will begin wearing graduated compression stockings class 1 on a daily basis, beginning first thing in the morning and removing them in the evening. The patient is instructed specifically not to sleep in the stockings.    The patient   will also begin using over-the-counter analgesics such as Motrin 600 mg po TID to help control the symptoms.    In addition, behavioral modification including elevation during the day will be initiated.    Duplex ultrasound of the right venous system shows mild deep reflux with extensive superficial reflux of the right great saphenous vein.  The left venous system is without evidence of reflux.  Further plans will be based on the ultrasound results and whether conservative therapies are successful at eliminating the pain and swelling.    3. Chronic venous insufficiency No surgery or intervention at this point in time.    I have discussed with the patient venous insufficiency and why it  causes symptoms. I have discussed with the patient the chronic skin changes that accompany venous insufficiency and the long term sequela such as infection and ulceration.  Patient will begin  wearing graduated compression stockings or compression wraps on a daily basis.  The patient will put the compression on first thing in the morning and removing them in the evening. The patient is instructed specifically not to sleep in the compression.    In addition, behavioral modification including several periods of elevation of the lower extremities during the day will be continued. I have demonstrated that proper elevation is a position with the ankles at heart level.  The patient is instructed to begin routine exercise, especially walking on a daily basis  Duplex ultrasound of the right venous system shows mild deep reflux with extensive superficial reflux of the right great saphenous vein.  The left venous system is without evidence of reflux.  Following the review of the ultrasound the patient will follow up in 2-3 months to reassess the degree of swelling and the control that graduated compression stockings or compression wraps  is offering.   At that time the patient can be assessed for a Lymph Pump depending on the  effectiveness of conservative therapy and the control of the associated lymphedema.   4. Benign essential HTN Continue antihypertensive medications as already ordered, these medications have been reviewed and there are no changes at this time.   5. 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

## 2022-05-23 ENCOUNTER — Telehealth: Payer: Self-pay | Admitting: Oncology

## 2022-05-23 NOTE — Telephone Encounter (Signed)
Yes smc is good

## 2022-05-23 NOTE — Telephone Encounter (Signed)
pt called in to be scheduled to see MD. Concerns of breast soreness. Pt has been scheduled for 10/4, please advise for earlier date

## 2022-05-26 ENCOUNTER — Encounter: Payer: Self-pay | Admitting: Medical Oncology

## 2022-05-26 ENCOUNTER — Inpatient Hospital Stay: Payer: Medicare Other | Attending: Hospice and Palliative Medicine | Admitting: Medical Oncology

## 2022-05-26 ENCOUNTER — Encounter: Payer: Medicare Other | Admitting: Hospice and Palliative Medicine

## 2022-05-26 VITALS — BP 111/71 | HR 66 | Temp 97.0°F | Ht 63.0 in | Wt 246.8 lb

## 2022-05-26 DIAGNOSIS — C50412 Malignant neoplasm of upper-outer quadrant of left female breast: Secondary | ICD-10-CM | POA: Diagnosis present

## 2022-05-26 DIAGNOSIS — N644 Mastodynia: Secondary | ICD-10-CM | POA: Insufficient documentation

## 2022-05-26 DIAGNOSIS — Z79811 Long term (current) use of aromatase inhibitors: Secondary | ICD-10-CM | POA: Insufficient documentation

## 2022-05-26 NOTE — Progress Notes (Signed)
Cellulitis 02/2022-rt lower leg  C/o left breast soreness. She ran into a door over the weekend. Getting better, just wanted to have checked since it was the breast she had cancer in.

## 2022-05-26 NOTE — Progress Notes (Signed)
Symptom Management [place] at [facility] Telephone:(336) [phone] Fax:(336) [phone]  Patient Care Team: Idelle Crouch, MD as PCP - General (Internal Medicine)   Name of the patient: Beth Hoover  [id number]  1945/12/01   Date of visit: 05/26/22  Reason for Consult: ESTHA FEW is a 76 y.o. 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 She states that symptoms started on Friday. She reports a soreness sensation in her middle breast. Resolved on Sunday on its own without any intervention. No known injury. No known breast changes (has chronically inverted left nipple), no nipple discharge, adenopathy, night sweats, unintentional weight loss. UTD on mammograms with her last being on 08/02/2021 which was BI-RADS Category 1.    Denies any neurologic complaints. Denies recent fevers or illnesses. Denies any easy bleeding or bruising. Reports good appetite and denies weight loss. Denies chest pain. Denies any nausea, vomiting, constipation, or diarrhea. Denies urinary complaints. Patient offers no further specific complaints today.    PAST MEDICAL HISTORY: Past Medical History:  Diagnosis Date   Anemia    Arthritis    osteoarthritis,rt.knee   Breast cancer ([place]) 2014   left breast with rad tx   Cancer ([place]) 2014   Left Breast CA with Lumpectomy and 36 Rad tx's.   Depression    Dyspnea    exertional dyspnea   Environmental allergies    History of hiatal hernia    Hx of pulmonary embolus    Hyperlipidemia    Hypertension    Obesity    Personal history of radiation therapy 2014   Left breast   PUD (peptic ulcer disease)    Sleep apnea    on C+PAP   Varicose veins of both lower extremities with inflammation     PAST SURGICAL HISTORY:  Past Surgical History:  Procedure  Laterality Date   ABDOMINAL HYSTERECTOMY     BREAST BIOPSY Left 06/06/2013   [facility]   BREAST LUMPECTOMY     COLONOSCOPY WITH PROPOFOL N/A 01/17/2018   Procedure: COLONOSCOPY WITH PROPOFOL;  Surgeon: Manya Silvas, MD;  Location: [facility] ENDOSCOPY;  Service: Endoscopy;  Laterality: N/A;   ESOPHAGOGASTRODUODENOSCOPY (EGD) WITH PROPOFOL N/A 01/17/2018   Procedure: ESOPHAGOGASTRODUODENOSCOPY (EGD) WITH PROPOFOL;  Surgeon: Manya Silvas, MD;  Location: [facility] ENDOSCOPY;  Service: Endoscopy;  Laterality: N/A;   HERNIA REPAIR     IR SCLEROTHERAPY OF A FLUID COLLECTION     JOINT REPLACEMENT Left 12/2008   VASCULAR SURGERY Right    VEIN STRIPPING    HEMATOLOGY/ONCOLOGY HISTORY:  Oncology History  Primary cancer of upper outer quadrant of left female breast ([place])  06/25/2013 Initial Diagnosis   Breast CA ([place]), left breast, T1a N0 M0, stage IA ER/PR positive HER-2 receptor-negative by [facility]   06/25/2013 Surgery   Left partial mastectomy    - 09/12/2013 Radiation Therapy   Completed radiation    - 12/11/2013 Anti-estrogen oral therapy   Discontinue letrozole due to side effect   12/11/2013 -  Anti-estrogen oral therapy   Started Arimidex     ALLERGIES:  is allergic to amoxicillin-pot clavulanate, actifed cold-allergy [chlorpheniramine-phenylephrine], septra [sulfamethoxazole-trimethoprim], and sulfa antibiotics.  MEDICATIONS:  Current Outpatient Medications  Medication Sig Dispense Refill   acetaminophen (TYLENOL) 500 MG tablet Take 500 mg by mouth every 6 (six) hours as needed.  atorvastatin (LIPITOR) 40 MG tablet Take 40 mg by mouth daily at 6 PM.      baclofen (LIORESAL) 10 MG tablet Take 10 mg by mouth 3 (three) times daily.     hydrochlorothiazide (HYDRODIURIL) 25 MG tablet Take 25 mg by mouth daily.     ketoconazole (NIZORAL) 2 % cream APPLY TO AFFECTED AREAS TOPICALLY TWICE DAILY AS DIRECTED 15 g 2   metoprolol (LOPRESSOR) 50 MG tablet Take 50 mg by mouth 2 (two) times daily.       Multiple Vitamin (MULTI-VITAMINS) TABS Take 1 tablet by mouth daily.      pantoprazole (PROTONIX) 40 MG tablet Take 40 mg by mouth 2 (two) times daily.      telmisartan (MICARDIS) 40 MG tablet Take 40 mg by mouth daily.     traZODone (DESYREL) 100 MG tablet Take 100 mg by mouth at bedtime.     venlafaxine XR (EFFEXOR-XR) 150 MG 24 hr capsule Take 150 mg by mouth daily with breakfast.      XARELTO 20 MG TABS tablet Take 20 mg by mouth daily.     benzonatate (TESSALON) 200 MG capsule Take 200 mg by mouth 3 (three) times daily as needed.     clotrimazole (CLOTRIMAZOLE AF) 1 % cream Apply 1 application topically 2 (two) times daily. 30 g 0   doxycycline (VIBRA-TABS) 100 MG tablet Take 100 mg by mouth 2 (two) times daily. (Patient not taking: Reported on 05/02/2022)     NEOMYCIN-POLYMYXIN-HYDROCORTISONE (CORTISPORIN) 1 % SOLN OTIC solution Apply 1-2 drops to toe BID after soaking 10 mL 1   No current facility-administered medications for this visit.    VITAL SIGNS: BP 111/71 (BP Location: Right Arm, Patient Position: Sitting, Cuff Size: Large)   Pulse 66   Temp (!) 97 F (36.1 C) (Tympanic)   Ht '5\' 3"'  (1.6 m)   Wt 246 lb 12.8 oz (111.9 kg)   SpO2 98%   BMI 43.72 kg/m  Filed Weights   05/26/22 1428  Weight: 246 lb 12.8 oz (111.9 kg)    Estimated body mass index is 43.72 kg/m as calculated from the following:   Height as of this encounter: '5\' 3"'  (1.6 m).   Weight as of this encounter: 246 lb 12.8 oz (111.9 kg).  LABS: CBC:    Component Value Date/Time   WBC 7.0 09/24/2019 1012   HGB 14.5 09/24/2019 1012   HGB 13.8 04/22/2014 1005   HCT 44.6 09/24/2019 1012   HCT 41.7 04/22/2014 1005   PLT 389 09/24/2019 1012   PLT 342 04/22/2014 1005   MCV 93.7 09/24/2019 1012   MCV 91 04/22/2014 1005   NEUTROABS 5.0 09/24/2019 1012   NEUTROABS 4.9 04/22/2014 1005   LYMPHSABS 1.4 09/24/2019 1012   LYMPHSABS 0.9 (L) 04/22/2014 1005   MONOABS 0.5 09/24/2019 1012   MONOABS 0.6 04/22/2014 1005    EOSABS 0.1 09/24/2019 1012   EOSABS 0.3 04/22/2014 1005   BASOSABS 0.0 09/24/2019 1012   BASOSABS 0.1 04/22/2014 1005   Comprehensive Metabolic Panel:    Component Value Date/Time   NA 141 02/25/2019 1436   NA 137 04/22/2014 1005   K 3.5 02/25/2019 1436   K 4.6 04/22/2014 1005   CL 104 02/25/2019 1436   CL 102 04/22/2014 1005   CO2 23 02/25/2019 1436   CO2 24 04/22/2014 1005   BUN 15 02/25/2019 1436   BUN 22 (H) 04/22/2014 1005   CREATININE 0.89 02/25/2019 1436   CREATININE 1.25 04/22/2014 1005  GLUCOSE 132 (H) 02/25/2019 1436   GLUCOSE 105 (H) 04/22/2014 1005   CALCIUM 9.3 02/25/2019 1436   CALCIUM 9.3 04/22/2014 1005   AST 33 02/25/2019 1436   AST 23 04/22/2014 1005   ALT 37 02/25/2019 1436   ALT 36 04/22/2014 1005   ALKPHOS 98 02/25/2019 1436   ALKPHOS 121 (H) 04/22/2014 1005   BILITOT 0.3 02/25/2019 1436   BILITOT 0.3 04/22/2014 1005   PROT 7.7 02/25/2019 1436   PROT 7.4 04/22/2014 1005   ALBUMIN 4.1 02/25/2019 1436   ALBUMIN 3.6 04/22/2014 1005    RADIOGRAPHIC STUDIES: VAS [place] LOWER EXTREMITY VENOUS REFLUX  Result Date: 05/02/2022  Lower Venous Reflux Study Patient Name:  DERRICKA MERTZ  Date of Exam:   05/02/2022 Medical Rec #: [id number]      Accession #:    [id number] Date of Birth: Feb 11, 1946      Patient Gender: F Patient Age:   76 years Exam Location:  [facility] Procedure:      VAS [place] LOWER EXTREMITY VENOUS REFLUX Referring Phys: Eulogio Ditch --------------------------------------------------------------------------------  Indications: Swelling.  Performing Technologist: Blondell Reveal RT, RDMS, RVT  Examination Guidelines: A complete evaluation includes B-mode imaging, spectral Doppler, color Doppler, and power Doppler as needed of all accessible portions of each vessel. Bilateral testing is considered an integral part of a complete examination. Limited examinations for reoccurring indications may be performed as noted. The reflux portion of the exam  is performed with the patient in reverse Trendelenburg. Significant venous reflux is defined as >500 ms in the superficial venous system, and >1 second in the deep venous system.  Venous Reflux Times +--------------+---------+------+-----------+------------+--------+ RIGHT         Reflux NoRefluxReflux TimeDiameter cmsComments                         Yes                                  +--------------+---------+------+-----------+------------+--------+ CFV                     yes   >1 second                      +--------------+---------+------+-----------+------------+--------+ FV mid        no                                             +--------------+---------+------+-----------+------------+--------+ Popliteal     no                                             +--------------+---------+------+-----------+------------+--------+ GSV at [facility]    no                            1.0              +--------------+---------+------+-----------+------------+--------+ GSV prox thigh          yes    >500 ms      0.54             +--------------+---------+------+-----------+------------+--------+ GSV mid thigh  yes    >500 ms      0.41             +--------------+---------+------+-----------+------------+--------+ GSV dist thigh          yes    >500 ms      0.41             +--------------+---------+------+-----------+------------+--------+ GSV at knee             yes    >500 ms      0.39             +--------------+---------+------+-----------+------------+--------+ GSV prox calf           yes    >500 ms      0.43             +--------------+---------+------+-----------+------------+--------+ SSV prox calf no                                             +--------------+---------+------+-----------+------------+--------+  +--------------+---------+------+-----------+------------+--------+ LEFT          Reflux NoRefluxReflux  TimeDiameter cmsComments                         Yes                                  +--------------+---------+------+-----------+------------+--------+ CFV           no                                             +--------------+---------+------+-----------+------------+--------+ FV mid        no                                             +--------------+---------+------+-----------+------------+--------+ Popliteal     no                                             +--------------+---------+------+-----------+------------+--------+ GSV at [facility]    no                                             +--------------+---------+------+-----------+------------+--------+ GSV prox thighno                                             +--------------+---------+------+-----------+------------+--------+ GSV at knee   no                                             +--------------+---------+------+-----------+------------+--------+ SSV prox calf no                                             +--------------+---------+------+-----------+------------+--------+  Triphasic Doppler waveforms noted in the bilateral distal tibial arteries. Summary: Bilateral: - No evidence of deep vein thrombosis seen in the lower extremities, bilaterally, from the common femoral through the popliteal veins. - No evidence of superficial venous thrombosis in the lower extremities, bilaterally.  Right: - No evidence of superficial venous reflux seen in the right short saphenous vein. - Venous reflux is noted in the right common femoral vein. - Venous reflux is noted in the right greater saphenous vein in the thigh. - Venous reflux is noted in the right greater saphenous vein in the calf.  Left: - There is no evidence of venous reflux seen in the left lower extremity.  *See table(s) above for measurements and observations. Electronically signed by Hortencia Pilar MD on 05/02/2022 at 6:04:25 PM.    Final      PERFORMANCE STATUS (ECOG) : 1 - Symptomatic but completely ambulatory  Review of Systems Unless otherwise noted, a complete review of systems is negative.  Physical Exam General: NAD Cardiovascular: regular rate and rhythm Pulmonary: clear ant fields Breast: Surgical scar well healed without abnormalities. There is mild fullness of the left breast deep to the areola. This area is non-tender, mobile and smooth. No adenopathy, skin texture changes or nipple discharge. Right breast without masses, discharge, skin changes or tenderness. No breast erythema or discharge.  Skin: no rashes  Assessment and Plan- Patient is a 76 y.o. female    Encounter Diagnoses  Name Primary?   Primary cancer of upper outer quadrant of left female breast ([place]) Yes   Breast pain, left    New left breast pain and fullness. Given her history and new symptoms we will investigate further with [place] and diagnostic mammogram. Reassured patient that the area is smooth, mobile in nature so we are completing imaging out of precaution. Discussed red flag signs and symptoms. Continue normal follow up interval unless mammogram is abnormal.     Patient expressed understanding and was in agreement with this plan. She also understands that She can call clinic at any time with any questions, concerns, or complaints.   Thank you for allowing me to participate in the care of this very pleasant patient.   Time Total: 25  Visit consisted of counseling and education dealing with the complex and emotionally intense issues of symptom management in the setting of serious illness.Greater than 50%  of this time was spent counseling and coordinating care related to the above assessment and plan.  Signed by: Nelwyn Salisbury, PA-C

## 2022-06-14 ENCOUNTER — Ambulatory Visit
Admission: RE | Admit: 2022-06-14 | Discharge: 2022-06-14 | Disposition: A | Discharge status: Home / Self Care | Payer: Medicare Other | Source: Ambulatory Visit | Attending: Medical Oncology | Admitting: Medical Oncology

## 2022-06-14 DIAGNOSIS — C50412 Malignant neoplasm of upper-outer quadrant of left female breast: Secondary | ICD-10-CM | POA: Diagnosis present

## 2022-06-15 ENCOUNTER — Ambulatory Visit: Payer: Medicare Other | Admitting: Oncology

## 2022-06-24 ENCOUNTER — Other Ambulatory Visit: Payer: Self-pay | Admitting: Internal Medicine

## 2022-06-24 DIAGNOSIS — Z1231 Encounter for screening mammogram for malignant neoplasm of breast: Secondary | ICD-10-CM

## 2022-07-11 ENCOUNTER — Encounter (INDEPENDENT_AMBULATORY_CARE_PROVIDER_SITE_OTHER): Payer: Self-pay

## 2022-08-05 ENCOUNTER — Other Ambulatory Visit: Payer: Self-pay | Admitting: Podiatry

## 2022-08-08 ENCOUNTER — Other Ambulatory Visit: Payer: Self-pay | Admitting: *Deleted

## 2022-08-11 ENCOUNTER — Ambulatory Visit
Admission: RE | Admit: 2022-08-11 | Discharge: 2022-08-11 | Disposition: A | Discharge status: Home / Self Care | Payer: Medicare Other | Source: Ambulatory Visit | Attending: Internal Medicine | Admitting: Internal Medicine

## 2022-08-11 ENCOUNTER — Ambulatory Visit (INDEPENDENT_AMBULATORY_CARE_PROVIDER_SITE_OTHER): Payer: Medicare Other | Admitting: Vascular Surgery

## 2022-08-11 DIAGNOSIS — Z1231 Encounter for screening mammogram for malignant neoplasm of breast: Secondary | ICD-10-CM | POA: Diagnosis not present

## 2022-10-24 ENCOUNTER — Ambulatory Visit (INDEPENDENT_AMBULATORY_CARE_PROVIDER_SITE_OTHER): Payer: Medicare Other | Admitting: Podiatry

## 2022-10-24 ENCOUNTER — Encounter: Payer: Self-pay | Admitting: Podiatry

## 2022-10-24 ENCOUNTER — Ambulatory Visit (INDEPENDENT_AMBULATORY_CARE_PROVIDER_SITE_OTHER): Payer: Medicare Other

## 2022-10-24 DIAGNOSIS — M898X9 Other specified disorders of bone, unspecified site: Secondary | ICD-10-CM

## 2022-10-24 NOTE — Progress Notes (Signed)
She presents today concerned about her left hallux.  States that she had an ingrown procedure done back in March and she says it still sort of red on the end and from time to time and tender with certain shoes.  Objective: Vital signs are stable she is alert and oriented x 3.  Pulses are palpable.  Her left foot does demonstrate a metatarsus adductus with digital adductus and varus position of the hallux.  She also has extension at the interphalangeal joint of the hallux.  This accompanied with shoe gear has resulted in a reactive hyperkeratotic lesion to the distal medial aspect of the hallux.  Radiographs were taken today to confirm that there was no exostosis present in that area.  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  Plan: Dispensed silicone padding discussed different shoe gear.  Follow-up with her as needed

## 2023-05-19 ENCOUNTER — Other Ambulatory Visit: Payer: Self-pay | Admitting: Nurse Practitioner

## 2023-05-19 DIAGNOSIS — R198 Other specified symptoms and signs involving the digestive system and abdomen: Secondary | ICD-10-CM

## 2023-05-19 DIAGNOSIS — R194 Change in bowel habit: Secondary | ICD-10-CM

## 2023-05-19 DIAGNOSIS — R1031 Right lower quadrant pain: Secondary | ICD-10-CM

## 2023-05-25 ENCOUNTER — Ambulatory Visit
Admission: RE | Admit: 2023-05-25 | Discharge: 2023-05-25 | Disposition: A | Discharge status: Home / Self Care | Payer: Medicare Other | Source: Ambulatory Visit | Attending: Nurse Practitioner | Admitting: Nurse Practitioner

## 2023-05-25 DIAGNOSIS — R194 Change in bowel habit: Secondary | ICD-10-CM | POA: Diagnosis present

## 2023-05-25 DIAGNOSIS — R198 Other specified symptoms and signs involving the digestive system and abdomen: Secondary | ICD-10-CM | POA: Insufficient documentation

## 2023-05-25 DIAGNOSIS — R1031 Right lower quadrant pain: Secondary | ICD-10-CM | POA: Diagnosis present

## 2023-05-25 MED ORDER — IOHEXOL 300 MG/ML  SOLN
100.0000 mL | Freq: Once | INTRAMUSCULAR | Status: AC | PRN
  Administered 2023-05-25: 100 mL via INTRAVENOUS

## 2023-05-31 ENCOUNTER — Other Ambulatory Visit: Payer: Self-pay | Admitting: Internal Medicine

## 2023-05-31 DIAGNOSIS — Z1231 Encounter for screening mammogram for malignant neoplasm of breast: Secondary | ICD-10-CM

## 2023-06-30 DIAGNOSIS — J449 Chronic obstructive pulmonary disease, unspecified: Secondary | ICD-10-CM | POA: Insufficient documentation

## 2023-07-18 ENCOUNTER — Encounter: Payer: Self-pay | Admitting: Internal Medicine

## 2023-07-19 ENCOUNTER — Ambulatory Visit: Payer: Medicare Other | Admitting: Psychiatry

## 2023-07-26 ENCOUNTER — Encounter: Payer: Self-pay | Admitting: Internal Medicine

## 2023-08-01 ENCOUNTER — Encounter: Payer: Self-pay | Admitting: Internal Medicine

## 2023-08-02 ENCOUNTER — Encounter
Admission: RE | Disposition: A | Discharge status: Home / Self Care | Payer: Self-pay | Source: Home / Self Care | Attending: Internal Medicine

## 2023-08-02 ENCOUNTER — Ambulatory Visit: Payer: Medicare Other | Admitting: Anesthesiology

## 2023-08-02 ENCOUNTER — Ambulatory Visit
Admission: RE | Admit: 2023-08-02 | Discharge: 2023-08-02 | Disposition: A | Discharge status: Home / Self Care | Payer: Medicare Other | Attending: Internal Medicine | Admitting: Internal Medicine

## 2023-08-02 ENCOUNTER — Encounter: Payer: Self-pay | Admitting: Internal Medicine

## 2023-08-02 DIAGNOSIS — Z853 Personal history of malignant neoplasm of breast: Secondary | ICD-10-CM | POA: Insufficient documentation

## 2023-08-02 DIAGNOSIS — I739 Peripheral vascular disease, unspecified: Secondary | ICD-10-CM | POA: Insufficient documentation

## 2023-08-02 DIAGNOSIS — E785 Hyperlipidemia, unspecified: Secondary | ICD-10-CM | POA: Diagnosis not present

## 2023-08-02 DIAGNOSIS — Z8711 Personal history of peptic ulcer disease: Secondary | ICD-10-CM | POA: Insufficient documentation

## 2023-08-02 DIAGNOSIS — Z86718 Personal history of other venous thrombosis and embolism: Secondary | ICD-10-CM | POA: Insufficient documentation

## 2023-08-02 DIAGNOSIS — K219 Gastro-esophageal reflux disease without esophagitis: Secondary | ICD-10-CM | POA: Diagnosis not present

## 2023-08-02 DIAGNOSIS — Z86711 Personal history of pulmonary embolism: Secondary | ICD-10-CM | POA: Diagnosis not present

## 2023-08-02 DIAGNOSIS — Z1211 Encounter for screening for malignant neoplasm of colon: Secondary | ICD-10-CM | POA: Insufficient documentation

## 2023-08-02 DIAGNOSIS — G4733 Obstructive sleep apnea (adult) (pediatric): Secondary | ICD-10-CM | POA: Insufficient documentation

## 2023-08-02 DIAGNOSIS — Z800 Family history of malignant neoplasm of digestive organs: Secondary | ICD-10-CM | POA: Insufficient documentation

## 2023-08-02 DIAGNOSIS — K5730 Diverticulosis of large intestine without perforation or abscess without bleeding: Secondary | ICD-10-CM | POA: Diagnosis not present

## 2023-08-02 DIAGNOSIS — K449 Diaphragmatic hernia without obstruction or gangrene: Secondary | ICD-10-CM | POA: Insufficient documentation

## 2023-08-02 DIAGNOSIS — I10 Essential (primary) hypertension: Secondary | ICD-10-CM | POA: Diagnosis not present

## 2023-08-02 DIAGNOSIS — K640 First degree hemorrhoids: Secondary | ICD-10-CM | POA: Insufficient documentation

## 2023-08-02 DIAGNOSIS — F32A Depression, unspecified: Secondary | ICD-10-CM | POA: Diagnosis not present

## 2023-08-02 DIAGNOSIS — Z7901 Long term (current) use of anticoagulants: Secondary | ICD-10-CM | POA: Insufficient documentation

## 2023-08-02 HISTORY — PX: COLONOSCOPY WITH PROPOFOL: SHX5780

## 2023-08-02 HISTORY — PX: BIOPSY: SHX5522

## 2023-08-02 SURGERY — COLONOSCOPY WITH PROPOFOL
Anesthesia: General

## 2023-08-02 MED ORDER — PROPOFOL 10 MG/ML IV BOLUS
INTRAVENOUS | Status: AC
  Filled 2023-08-02: qty 20

## 2023-08-02 MED ORDER — SODIUM CHLORIDE 0.9 % IV SOLN: INTRAVENOUS | Status: DC

## 2023-08-02 MED ORDER — PROPOFOL 500 MG/50ML IV EMUL
INTRAVENOUS | Status: DC | PRN
  Administered 2023-08-02: 150 ug/kg/min via INTRAVENOUS

## 2023-08-02 NOTE — Op Note (Addendum)
[facility]astroenterology Patient Name: Beth Hoover Procedure Date: 08/02/2023 2:04 PM MRN: [id number] Account #: [id number] Date of Birth: [birth year] Admit Type: Outpatient Age: 77 Room: [facility] ENDO ROOM 2 Gender: Female Note Status: Supervisor Override Instrument Name: Prentice Docker [id number] Procedure:             Colonoscopy Indications:           Screening in patient at increased risk: Family history                         of 1st-degree relative with colorectal cancer, Change                         in bowel habits Providers:             Boykin Nearing. Norma Fredrickson MD, MD Referring MD:          Duane Lope. Judithann Sheen, MD (Referring MD) Medicines:             Propofol per Anesthesia Complications:         No immediate complications. Estimated blood loss:                         Minimal. Procedure:             Pre-Anesthesia Assessment:                        - The risks and benefits of the procedure and the                         sedation options and risks were discussed with the                         patient. All questions were answered and informed                         consent was obtained.                        - Patient identification and proposed procedure were                         verified prior to the procedure by the nurse. The                         procedure was verified in the procedure room.                        - ASA Grade Assessment: III - A patient with severe                         systemic disease.                        - After reviewing the risks and benefits, the patient                         was deemed in satisfactory condition to undergo the  procedure.                        After obtaining informed consent, the colonoscope was                         passed under direct vision. Throughout the procedure,                         the patient's blood pressure, pulse, and oxygen                         saturations  were monitored continuously. The                         Colonoscope was introduced through the anus and                         advanced to the the cecum, identified by appendiceal                         orifice and ileocecal valve. The colonoscopy was                         performed without difficulty. The patient tolerated                         the procedure well. The quality of the bowel                         preparation was good. The ileocecal valve, appendiceal                         orifice, and rectum were photographed. Findings:      The perianal and digital rectal examinations were normal. Pertinent       negatives include normal sphincter tone and no palpable rectal lesions.      Non-bleeding internal hemorrhoids were found during retroflexion. The       hemorrhoids were Grade I (internal hemorrhoids that do not prolapse).      Many small-mouthed diverticula were found in the sigmoid colon. There       was no evidence of diverticular bleeding.      Normal mucosa was found in the entire colon. Biopsies for histology were       taken with a cold forceps from the random colon for evaluation of       microscopic colitis. Verification of patient identification for the       specimen was done by the nurse using the patient's name and birth date.       Estimated blood loss was minimal. Impression:            - Non-bleeding internal hemorrhoids.                        - Mild diverticulosis in the sigmoid colon. There was                         no evidence of diverticular bleeding.                        -  Normal mucosa in the entire examined colon. Biopsied. Recommendation:        - Patient has a contact number available for                         emergencies. The signs and symptoms of potential                         delayed complications were discussed with the patient.                         Return to normal activities tomorrow. Written                         discharge  instructions were provided to the patient.                        - Resume previous diet.                        - Continue present medications.                        - No repeat colonoscopy due to current age (46 years                         or older) and the absence of colonic polyps.                        - You do NOT require further colon cancer screening                         measures (Annual stool testing (i.e. hemoccult, FIT,                         cologuard), sigmoidoscopy, colonoscopy or CT                         colonography). You should share this recommendation                         with your Primary Care provider.                        - Return to GI office PRN.                        - The findings and recommendations were discussed with                         the patient. Procedure Code(s):     --- Professional ---                        [phone], Colonoscopy, flexible; with biopsy, single or                         multiple Diagnosis Code(s):     --- Professional ---  K57.30, Diverticulosis of large intestine without                         perforation or abscess without bleeding                        R19.4, Change in bowel habit                        K64.0, First degree hemorrhoids CPT copyright 2022 American Medical Association. All rights reserved. The codes documented in this report are preliminary and upon coder review may  be revised to meet current compliance requirements. [name] MD, MD 08/02/2023 2:32:10 PM This report has been signed electronically. Number of Addenda: 0 Note Initiated On: 08/02/2023 2:04 PM Scope Withdrawal Time: 0 hours 5 minutes 59 seconds  Total Procedure Duration: 0 hours 15 minutes 25 seconds  Estimated Blood Loss:  Estimated blood loss was minimal.      [facility]

## 2023-08-02 NOTE — Anesthesia Preprocedure Evaluation (Signed)
Anesthesia Evaluation  Patient identified by MRN, date of birth, ID band Patient awake    Reviewed: Allergy & Precautions, H&P , NPO status , Patient's Chart, lab work & pertinent test results, reviewed documented beta blocker date and time   Airway Mallampati: II  TM Distance: >3 FB Neck ROM: full    Dental  (+) Teeth Intact   Pulmonary shortness of breath and with exertion, sleep apnea and Continuous Positive Airway Pressure Ventilation    Pulmonary exam normal        Cardiovascular Exercise Tolerance: Poor hypertension, On Medications + Peripheral Vascular Disease  (-) Past MI and (-) Cardiac Stents Normal cardiovascular exam Rhythm:regular Rate:Normal     Neuro/Psych  PSYCHIATRIC DISORDERS  Depression     Neuromuscular disease    GI/Hepatic Neg liver ROS, hiatal hernia, PUD,,,  Endo/Other    Renal/GU negative Renal ROS  negative genitourinary   Musculoskeletal   Abdominal  (+) + obese  Peds  Hematology  (+) Blood dyscrasia, anemia   Anesthesia Other Findings Past Medical History: No date: Anemia No date: Arthritis     Comment:  osteoarthritis,rt.knee 2014: Breast cancer ([facility])     Comment:  left breast with rad tx 2014: Cancer ([facility])     Comment:  Left Breast CA with Lumpectomy and 36 Rad tx's. No date: Depression No date: Dyspnea     Comment:  exertional dyspnea No date: Environmental allergies No date: History of hiatal hernia No date: Hx of pulmonary embolus No date: Hyperlipidemia No date: Hypertension No date: Obesity No date: PUD (peptic ulcer disease) No date: Sleep apnea     Comment:  on C+PAP No date: Varicose veins of both lower extremities with inflammation Past Surgical History: No date: ABDOMINAL HYSTERECTOMY 06/06/2013: BREAST BIOPSY; Left     Comment:  positive No date: HERNIA REPAIR No date: IR SCLEROTHERAPY OF A FLUID COLLECTION 12/2008: JOINT REPLACEMENT; Left No date: VASCULAR  SURGERY; Right     Comment:  VEIN STRIPPING BMI    Body Mass Index:  41.37 kg/m     Reproductive/Obstetrics negative OB ROS                             Anesthesia Physical Anesthesia Plan  ASA: 2  Anesthesia Plan: General   Post-op Pain Management: Minimal or no pain anticipated   Induction: Intravenous  PONV Risk Score and Plan: 3 and Propofol infusion, TIVA and Ondansetron  Airway Management Planned: Nasal Cannula  Additional Equipment: None  Intra-op Plan:   Post-operative Plan:   Informed Consent: I have reviewed the patients History and Physical, chart, labs and discussed the procedure including the risks, benefits and alternatives for the proposed anesthesia with the patient or authorized representative who has indicated his/her understanding and acceptance.     Dental Advisory Given  Plan Discussed with: CRNA  Anesthesia Plan Comments: (Discussed risks of anesthesia with patient, including possibility of difficulty with spontaneous ventilation under anesthesia necessitating airway intervention, PONV, and rare risks such as cardiac or respiratory or neurological events, and allergic reactions. Discussed the role of CRNA in patient's perioperative care. Patient understands.)        Anesthesia Quick Evaluation

## 2023-08-02 NOTE — Transfer of Care (Signed)
Immediate Anesthesia Transfer of Care Note  Patient: Beth Hoover  Procedure(s) Performed: COLONOSCOPY WITH PROPOFOL BIOPSY  Patient Location: Endoscopy Unit  Anesthesia Type:General  Level of Consciousness: drowsy and patient cooperative  Airway & Oxygen Therapy: Patient Spontanous Breathing and Patient connected to face mask oxygen  Post-op Assessment: Report given to RN and Post -op Vital signs reviewed and stable  Post vital signs: Reviewed and stable  Last Vitals:  Vitals Value Taken Time  BP 145/62 08/02/23 1434  Temp 37 C 08/02/23 1434  Pulse 79 08/02/23 1434  Resp 16 08/02/23 1434  SpO2 99 % 08/02/23 1434    Last Pain:  Vitals:   08/02/23 1434  TempSrc: Temporal  PainSc: Asleep         Complications: There were no known notable events for this encounter.

## 2023-08-02 NOTE — H&P (Signed)
Outpatient short stay form Pre-procedure 08/02/2023 1:58 PM Beth Hoover K. Norma Fredrickson, M.D.  Primary Physician: Aram Beecham, M.D.  Reason for visit:   1. Change in bowel habits  2. Abdominal pain, RLQ (right lower quadrant)  3. Alternating constipation and diarrhea  4. FH: colon cancer   History of present illness:  Beth Hoover is a 77 year old woman with history of hypertension, hyperlipidemia, OSA, PE x2 (2017, 2018), DVT, chronic anticoagulation (Xarelto), breast cancer (2014), prediabetes, depression,IDA, GERD, gastric ulcer (2009) returns for follow-up colonoscopy for family history of colon cancer in father and change in bowel habits. She has mild RLQ tenderness on exam.     Current Facility-Administered Medications:    0.9 %  sodium chloride infusion, , Intravenous, Continuous, Ania Levay, Boykin Nearing, MD, Last Rate: 20 mL/hr at 08/02/23 1314, New Bag at 08/02/23 1314  Medications Prior to Admission  Medication Sig Dispense Refill Last Dose   atorvastatin (LIPITOR) 40 MG tablet Take 40 mg by mouth daily at 6 PM.    08/01/2023   desvenlafaxine (PRISTIQ) 100 MG 24 hr tablet Take 100 mg by mouth daily.   08/02/2023   fish oil-omega-3 fatty acids 1000 MG capsule Take 2 g by mouth daily.      hydrochlorothiazide (HYDRODIURIL) 25 MG tablet Take 25 mg by mouth daily.   08/02/2023   meloxicam (MOBIC) 15 MG tablet Take 15 mg by mouth daily.      metoprolol (LOPRESSOR) 50 MG tablet Take 50 mg by mouth 2 (two) times daily.    08/02/2023   metoprolol succinate (TOPROL-XL) 50 MG 24 hr tablet Take 50 mg by mouth daily. Take with or immediately following a meal.      pantoprazole (PROTONIX) 40 MG tablet Take 40 mg by mouth 2 (two) times daily.    08/01/2023   telmisartan (MICARDIS) 40 MG tablet Take 40 mg by mouth daily.   08/01/2023   traZODone (DESYREL) 100 MG tablet Take 100 mg by mouth at bedtime.   08/01/2023   acetaminophen (TYLENOL) 500 MG tablet Take 500 mg by mouth every 6 (six) hours as needed.       anastrozole (ARIMIDEX) 1 MG tablet Take 1 mg by mouth daily. (Patient not taking: Reported on 08/02/2023)   Completed Course   baclofen (LIORESAL) 10 MG tablet Take 10 mg by mouth 3 (three) times daily.      gabapentin (NEURONTIN) 300 MG capsule Take 300 mg by mouth at bedtime. (Patient not taking: Reported on 08/02/2023)   Not Taking   ketoconazole (NIZORAL) 2 % cream APPLY TO AFFECTED AREAS TOPICALLY TWICE DAILY AS DIRECTED 15 g 2    Multiple Vitamin (MULTI-VITAMINS) TABS Take 1 tablet by mouth daily.       venlafaxine XR (EFFEXOR-XR) 150 MG 24 hr capsule Take 150 mg by mouth daily with breakfast.  (Patient not taking: Reported on 08/02/2023)   Not Taking   XARELTO 20 MG TABS tablet Take 20 mg by mouth daily.   07/30/2023   zolpidem (AMBIEN) 10 MG tablet Take 10 mg by mouth at bedtime as needed for sleep. (Patient not taking: Reported on 08/02/2023)   Not Taking     Allergies  Allergen Reactions   Amoxicillin-Pot Clavulanate Diarrhea   Actifed Cold-Allergy [Chlorpheniramine-Phenylephrine] Palpitations   Septra [Sulfamethoxazole-Trimethoprim] Rash   Sulfa Antibiotics Rash     Past Medical History:  Diagnosis Date   Anemia    Arthritis    osteoarthritis,rt.knee   Breast cancer (HCC) 2014   left breast with  rad tx   Cancer [facility]) 2014   Left Breast CA with Lumpectomy and 36 Rad tx's.   Depression    Dyspnea    exertional dyspnea   Environmental allergies    History of hiatal hernia    Hx of pulmonary embolus    Hyperlipidemia    Hypertension    Obesity    Personal history of radiation therapy 2014   Left breast   PUD (peptic ulcer disease)    Sleep apnea    on C+PAP   Varicose veins of both lower extremities with inflammation     Review of systems:  Otherwise negative.    Physical Exam  Gen: Alert, oriented. Appears stated age.  HEENT: /AT. PERRLA. Lungs: CTA, no wheezes. CV: RR nl S1, S2. Abd: soft, benign, no masses. BS+ Ext: No edema. Pulses  2+    Planned procedures: Proceed with colonoscopy. The patient understands the nature of the planned procedure, indications, risks, alternatives and potential complications including but not limited to bleeding, infection, perforation, damage to internal organs and possible oversedation/side effects from anesthesia. The patient agrees and gives consent to proceed.  Please refer to procedure notes for findings, recommendations and patient disposition/instructions.     Kailany Dinunzio K. Norma Fredrickson, M.D. Gastroenterology 08/02/2023  1:58 PM

## 2023-08-03 ENCOUNTER — Encounter: Payer: Self-pay | Admitting: Internal Medicine

## 2023-08-03 LAB — SURGICAL PATHOLOGY

## 2023-08-03 NOTE — Anesthesia Postprocedure Evaluation (Signed)
Anesthesia Post Note  Patient: Beth Hoover  Procedure(s) Performed: COLONOSCOPY WITH PROPOFOL BIOPSY  Patient location during evaluation: PACU Anesthesia Type: General Level of consciousness: awake and alert Pain management: pain level controlled Vital Signs Assessment: post-procedure vital signs reviewed and stable Respiratory status: spontaneous breathing, nonlabored ventilation, respiratory function stable and patient connected to nasal cannula oxygen Cardiovascular status: blood pressure returned to baseline and stable Postop Assessment: no apparent nausea or vomiting Anesthetic complications: no   There were no known notable events for this encounter.   Last Vitals:  Vitals:   08/02/23 1444 08/02/23 1454  BP: (!) 152/68 (!) 151/77  Pulse: 72 73  Resp: 20 18  Temp:    SpO2: 100% 100%    Last Pain:  Vitals:   08/02/23 1454  TempSrc:   PainSc: 0-No pain                 Yevette Edwards

## 2023-09-19 ENCOUNTER — Ambulatory Visit
Admission: RE | Admit: 2023-09-19 | Discharge: 2023-09-19 | Disposition: A | Discharge status: Home / Self Care | Payer: Medicare Other | Source: Ambulatory Visit | Attending: Internal Medicine | Admitting: Internal Medicine

## 2023-09-19 ENCOUNTER — Ambulatory Visit: Payer: Medicare Other | Admitting: Psychiatry

## 2023-09-19 DIAGNOSIS — Z1231 Encounter for screening mammogram for malignant neoplasm of breast: Secondary | ICD-10-CM | POA: Insufficient documentation

## 2023-11-22 ENCOUNTER — Encounter: Payer: Self-pay | Admitting: Podiatry

## 2023-11-22 ENCOUNTER — Ambulatory Visit (INDEPENDENT_AMBULATORY_CARE_PROVIDER_SITE_OTHER)

## 2023-11-22 ENCOUNTER — Ambulatory Visit (INDEPENDENT_AMBULATORY_CARE_PROVIDER_SITE_OTHER): Payer: Medicare Other | Admitting: Podiatry

## 2023-11-22 DIAGNOSIS — M19072 Primary osteoarthritis, left ankle and foot: Secondary | ICD-10-CM | POA: Diagnosis not present

## 2023-11-22 DIAGNOSIS — M898X9 Other specified disorders of bone, unspecified site: Secondary | ICD-10-CM

## 2023-11-22 MED ORDER — TRIAMCINOLONE ACETONIDE 40 MG/ML IJ SUSP
20.0000 mg | Freq: Once | INTRAMUSCULAR | Status: AC
  Administered 2023-11-22: 20 mg

## 2023-11-22 NOTE — Progress Notes (Signed)
 She presents today chief complaint of painful left foot.  She says been aching for the past couple of months denies any injury thinks that is just arthritis and would like injections if possible.  Objective: Vital signs are stable she is alert and oriented x 3.  Pain on palpation dorsal aspect of the tarsometatarsal joints of the left foot.  Nonpulsatile nodularity most likely secondary to dorsal exostoses.  Radiographs taken today does demonstrate significant osteoarthritic change of the tarsometatarsal joints dorsal spurring joint space narrowing subchondral sclerosis and eburnation.  Assessment: Osteoarthritis TMT joints left.  Plan: Injected across the dorsal aspect of the left foot with Kenalog and local anesthetic she tolerated the procedure well without complications.

## 2023-12-17 ENCOUNTER — Ambulatory Visit
Admission: RE | Admit: 2023-12-17 | Discharge: 2023-12-17 | Disposition: A | Discharge status: Home / Self Care | Source: Ambulatory Visit | Attending: Emergency Medicine | Admitting: Emergency Medicine

## 2023-12-17 VITALS — BP 128/88 | HR 102 | Temp 97.9°F | Resp 18

## 2023-12-17 DIAGNOSIS — J441 Chronic obstructive pulmonary disease with (acute) exacerbation: Secondary | ICD-10-CM | POA: Diagnosis not present

## 2023-12-17 LAB — POC COVID19/FLU A&B COMBO
Covid Antigen, POC: NEGATIVE
Influenza A Antigen, POC: NEGATIVE
Influenza B Antigen, POC: NEGATIVE

## 2023-12-17 MED ORDER — DOXYCYCLINE HYCLATE 100 MG PO CAPS: 100.0000 mg | ORAL_CAPSULE | Freq: Two times a day (BID) | ORAL | 0 refills | Status: AC

## 2023-12-17 MED ORDER — PREDNISONE 10 MG PO TABS: 40.0000 mg | ORAL_TABLET | Freq: Every day | ORAL | 0 refills | Status: AC

## 2023-12-17 NOTE — Discharge Instructions (Addendum)
 Take the doxycycline and prednisone as directed.  Follow up with your primary care provider tomorrow.  Go to the emergency department if you have worsening symptoms.    COVID and flu negative.

## 2023-12-17 NOTE — ED Provider Notes (Signed)
 Beth Hoover    CSN: [id number] Arrival date & time: 12/17/23  1257      History   Chief Complaint Chief Complaint  Patient presents with   Cough    Cold rattling chest - Entered by patient    HPI Beth Hoover is a 78 y.o. female.   Patient presents with 5-day history of congestion, postnasal drip, productive cough.  No fever, chest pain, or shortness of breath.  Her medical history includes COPD.  She reports she has an albuterol inhaler which is in date and has plenty of activations; she has not needed to use it.  The history is provided by the patient and medical records.    Past Medical History:  Diagnosis Date   Anemia    Arthritis    osteoarthritis,rt.knee   Breast cancer (HCC) 2014   left breast with rad tx   Cancer (HCC) 2014   Left Breast CA with Lumpectomy and 36 Rad tx's.   Depression    Dyspnea    exertional dyspnea   Environmental allergies    History of hiatal hernia    Hx of pulmonary embolus    Hyperlipidemia    Hypertension    Obesity    Personal history of radiation therapy 2014   Left breast   PUD (peptic ulcer disease)    Sleep apnea    on C+PAP   Varicose veins of both lower extremities with inflammation     Patient Active Problem List   Diagnosis Date Noted   COPD (chronic obstructive pulmonary disease) (HCC) 06/30/2023   Prediabetes 03/11/2022   Current use of long term anticoagulation 10/24/2017   Hx of pulmonary embolus 10/24/2017   Chronic pain of right knee 09/14/2017   Primary osteoarthritis of right knee 09/14/2017   Other pulmonary embolism without acute cor pulmonale (HCC) 02/15/2017   History of DVT (deep vein thrombosis) 01/23/2017   Varicose veins of both lower extremities with inflammation 01/23/2017   Chronic venous insufficiency 01/23/2017   Atrophic vaginitis 05/10/2016   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   Renal duplicate collecting system, 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   Microscopic  hematuria 04/27/2016   Urinary urgency 04/27/2016   Recurrent pulmonary embolism (HCC) 02/02/2016   Absolute anemia 05/18/2015   Arthritis 05/18/2015   Clinical depression 05/18/2015   Allergy to environmental factors 05/18/2015   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   Adiposity 05/18/2015   Major depressive disorder, single episode 05/18/2015   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) 02/07/2014   Blood glucose elevated 02/07/2014   Obstructive apnea 02/07/2014   Derangement of medial meniscus, posterior horn 11/15/2013   Benign essential HTN 11/15/2013   History of artificial joint 11/15/2013    Past Surgical History:  Procedure Laterality Date   ABDOMINAL HYSTERECTOMY     BIOPSY  08/02/2023   Procedure: BIOPSY;  Surgeon: [name], MD;  Location: [facility] ENDOSCOPY;  Service: Gastroenterology;;   BREAST BIOPSY Left 06/06/2013   [facility]   BREAST LUMPECTOMY     COLONOSCOPY WITH PROPOFOL N/A 01/17/2018   Procedure: COLONOSCOPY WITH PROPOFOL;  Surgeon: Scot Jun, MD;  Location: [facility] ENDOSCOPY;  Service: Endoscopy;  Laterality: N/A;   COLONOSCOPY WITH PROPOFOL N/A 08/02/2023   Procedure: COLONOSCOPY WITH PROPOFOL;  Surgeon: Toledo, Boykin Nearing, MD;  Location: [facility] ENDOSCOPY;  Service: Gastroenterology;  Laterality: N/A;   ESOPHAGOGASTRODUODENOSCOPY (EGD) WITH PROPOFOL N/A 01/17/2018   Procedure: ESOPHAGOGASTRODUODENOSCOPY (EGD) WITH PROPOFOL;  Surgeon: Scot Jun,  MD;  Location: [facility] ENDOSCOPY;  Service: Endoscopy;  Laterality: N/A;   HERNIA REPAIR     IR SCLEROTHERAPY OF A FLUID COLLECTION     JOINT REPLACEMENT Left 12/2008   VASCULAR SURGERY Right    VEIN STRIPPING    OB History   No obstetric history on file.      Home Medications    Prior to Admission medications   Medication Sig Start Date End Date Taking? Authorizing Provider  doxycycline (VIBRAMYCIN) 100 MG capsule Take 1 capsule (100 mg total) by mouth 2 (two) times daily for 7 days.  12/17/23 12/24/23 Yes Mickie Bail, NP  predniSONE (DELTASONE) 10 MG tablet Take 4 tablets (40 mg total) by mouth daily for 5 days. 12/17/23 12/22/23 Yes Mickie Bail, NP  acetaminophen (TYLENOL) 500 MG tablet Take 500 mg by mouth every 6 (six) hours as needed.    [provider]  ARIPiprazole (ABILIFY) 10 MG tablet Take 10 mg by mouth daily.    [provider]  atorvastatin (LIPITOR) 40 MG tablet Take 40 mg by mouth daily at 6 PM.  11/18/14   [provider]  desvenlafaxine (PRISTIQ) 100 MG 24 hr tablet Take 100 mg by mouth daily.    [provider]  gabapentin (NEURONTIN) 300 MG capsule Take 300 mg by mouth at bedtime. Patient not taking: Reported on 08/02/2023    [provider]  hydrochlorothiazide (HYDRODIURIL) 25 MG tablet Take 25 mg by mouth daily.    [provider]  ketoconazole (NIZORAL) 2 % cream APPLY TO AFFECTED AREAS TOPICALLY TWICE DAILY AS DIRECTED 01/17/22   Hyatt, Max T, DPM  metoprolol (LOPRESSOR) 50 MG tablet Take 50 mg by mouth 2 (two) times daily.  11/18/14   [provider]  mometasone (ELOCON) 0.1 % cream Apply topically 2 (two) times daily. 10/26/23   [provider]  Multiple Vitamin (MULTI-VITAMINS) TABS Take 1 tablet by mouth daily.     [provider]  pantoprazole (PROTONIX) 40 MG tablet Take 40 mg by mouth 2 (two) times daily.  11/18/14   [provider]  telmisartan (MICARDIS) 40 MG tablet Take 40 mg by mouth daily. 07/19/21   [provider]  traZODone (DESYREL) 100 MG tablet Take 100 mg by mouth at bedtime.    [provider]  XARELTO 20 MG TABS tablet Take 20 mg by mouth daily. 01/06/17   [provider]    Family History Family History  Problem Relation Age of Onset   Breast cancer Maternal Aunt        60's   Cancer Maternal Aunt    Mitral valve prolapse Mother    Cancer Father    AAA (abdominal aortic aneurysm) Father    Rheumatic fever Sister     Breast cancer Other        paternal half sister    Social History Social History   Tobacco Use   Smoking status: Never   Smokeless tobacco: Never  Vaping Use   Vaping status: Never Used  Substance Use Topics   Alcohol use: Yes    Comment: OCASSIONAL   Drug use: Never     Allergies   Amoxicillin-pot clavulanate, Actifed cold-allergy [chlorpheniramine-phenylephrine], Septra [sulfamethoxazole-trimethoprim], and Sulfa antibiotics   Review of Systems Review of Systems  Constitutional:  Negative for chills and fever.  HENT:  Positive for congestion and postnasal drip. Negative for ear pain and sore throat.   Respiratory:  Positive for cough. Negative for shortness  of breath.   Cardiovascular:  Negative for chest pain and palpitations.     Physical Exam Triage Vital Signs ED Triage Vitals  Encounter Vitals Group     BP      Systolic BP Percentile      Diastolic BP Percentile      Pulse      Resp      Temp      Temp src      SpO2      Weight      Height      Head Circumference      Peak Flow      Pain Score      Pain Loc      Pain Education      Exclude from Growth Chart    No data found.  Updated Vital Signs BP 128/88   Pulse (!) 102   Temp 97.9 F (36.6 C)   Resp 18   SpO2 95%   Visual Acuity Right Eye Distance:   Left Eye Distance:   Bilateral Distance:    Right Eye Near:   Left Eye Near:    Bilateral Near:     Physical Exam Constitutional:      General: She is not in acute distress.    Appearance: She is obese.  HENT:     Right Ear: Tympanic membrane normal.     Left Ear: Tympanic membrane normal.     Nose: Nose normal.     Mouth/Throat:     Mouth: Mucous membranes are moist.     Pharynx: Oropharynx is clear.  Cardiovascular:     Rate and Rhythm: Normal rate and regular rhythm.     Heart sounds: Normal heart sounds.  Pulmonary:     Effort: Pulmonary effort is normal. No respiratory distress.     Breath sounds: Normal breath sounds.   Neurological:     Mental Status: She is alert.      UC Treatments / Results  Labs (all labs ordered are listed, but only abnormal results are displayed) Labs Reviewed  POC COVID19/FLU A&B COMBO - Normal    EKG   Radiology No results found.  Procedures Procedures (including critical care time)  Medications Ordered in UC Medications - No data to display  Initial Impression / Assessment and Plan / UC Course  I have reviewed the triage vital signs and the nursing notes.  Pertinent labs & imaging results that were available during my care of the patient were reviewed by me and considered in my medical decision making (see chart for details).   COPD exacerbation.  Patient has been symptomatic for 5 days.  O2 sat 95% on room air.  Rapid COVID and flu negative.  Treating with doxycycline and prednisone.  Encourage patient to use her albuterol inhaler if needed.  Instructed her to follow-up with her PCP tomorrow.  Education provided on COPD exacerbation.  ED precautions given.  She agrees to plan of care.    Final Clinical Impressions(s) / UC Diagnoses   Final diagnoses:  COPD exacerbation (HCC)     Discharge Instructions      Take the doxycycline and prednisone as directed.  Follow up with your primary care provider tomorrow.  Go to the emergency department if you have worsening symptoms.    COVID and flu negative.      ED Prescriptions     Medication Sig Dispense Auth. Provider   doxycycline (VIBRAMYCIN) 100 MG capsule Take 1 capsule (100  mg total) by mouth 2 (two) times daily for 7 days. 14 capsule Mickie Bail, NP   predniSONE (DELTASONE) 10 MG tablet Take 4 tablets (40 mg total) by mouth daily for 5 days. 20 tablet Mickie Bail, NP      PDMP not reviewed this encounter.   Mickie Bail, NP 12/17/23 1350

## 2024-01-29 ENCOUNTER — Ambulatory Visit
Admission: RE | Admit: 2024-01-29 | Discharge: 2024-01-29 | Disposition: A | Discharge status: Home / Self Care | Payer: Self-pay | Source: Ambulatory Visit | Attending: Emergency Medicine | Admitting: Emergency Medicine

## 2024-01-29 VITALS — BP 105/73 | HR 76 | Temp 97.9°F | Resp 19

## 2024-01-29 DIAGNOSIS — J069 Acute upper respiratory infection, unspecified: Secondary | ICD-10-CM | POA: Diagnosis not present

## 2024-01-29 MED ORDER — CEFDINIR 300 MG PO CAPS: 300.0000 mg | ORAL_CAPSULE | Freq: Two times a day (BID) | ORAL | 0 refills | Status: AC

## 2024-01-29 MED ORDER — ALBUTEROL SULFATE HFA 108 (90 BASE) MCG/ACT IN AERS: 2.0000 | INHALATION_SPRAY | Freq: Four times a day (QID) | RESPIRATORY_TRACT | 2 refills | Status: AC | PRN

## 2024-01-29 MED ORDER — PROMETHAZINE-DM 6.25-15 MG/5ML PO SYRP: 5.0000 mL | ORAL_SOLUTION | Freq: Every evening | ORAL | 0 refills | Status: AC | PRN

## 2024-01-29 MED ORDER — BENZONATATE 100 MG PO CAPS: 100.0000 mg | ORAL_CAPSULE | Freq: Three times a day (TID) | ORAL | 0 refills | Status: AC

## 2024-01-29 NOTE — ED Triage Notes (Signed)
 Pt had dry cough and nasal congestion since last Monday. Took OTC cough syrup.

## 2024-01-29 NOTE — ED Provider Notes (Signed)
 Arlander Bellman    CSN: [id number] Arrival date & time: 01/29/24  1048      History   Chief Complaint Chief Complaint  Patient presents with   Cough    I have COPD - Entered by patient   Nasal Congestion    HPI SHELENE KRAGE is a 78 y.o. female.   Patient presents for evaluation of nasal congestion, rhinorrhea, nonproductive "croupy" cough and sinus pressure across the bilateral cheeks present for 7 days.  Has attempted use of over-the-counter cough syrup and bourbon with lemon and honey.  No known sick contacts prior.  History of COPD.  Denies fever, shortness of breath or wheezing.  Past Medical History:  Diagnosis Date   Anemia    Arthritis    osteoarthritis,rt.knee   Breast cancer (HCC) 2014   left breast with rad tx   Cancer (HCC) 2014   Left Breast CA with Lumpectomy and 36 Rad tx's.   Depression    Dyspnea    exertional dyspnea   Environmental allergies    History of hiatal hernia    Hx of pulmonary embolus    Hyperlipidemia    Hypertension    Obesity    Personal history of radiation therapy 2014   Left breast   PUD (peptic ulcer disease)    Sleep apnea    on C+PAP   Varicose veins of both lower extremities with inflammation     Patient Active Problem List   Diagnosis Date Noted   COPD (chronic obstructive pulmonary disease) (HCC) 06/30/2023   Prediabetes 03/11/2022   Current use of long term anticoagulation 10/24/2017   Hx of pulmonary embolus 10/24/2017   Chronic pain of right knee 09/14/2017   Primary osteoarthritis of right knee 09/14/2017   Other pulmonary embolism without acute cor pulmonale (HCC) 02/15/2017   History of DVT (deep vein thrombosis) 01/23/2017   Varicose veins of both lower extremities with inflammation 01/23/2017   Chronic venous insufficiency 01/23/2017   Atrophic vaginitis 05/10/2016   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   Renal duplicate collecting system, 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   Microscopic  hematuria 04/27/2016   Urinary urgency 04/27/2016   Recurrent pulmonary embolism (HCC) 02/02/2016   Absolute anemia 05/18/2015   Arthritis 05/18/2015   Clinical depression 05/18/2015   Allergy to environmental factors 05/18/2015   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   Adiposity 05/18/2015   Major depressive disorder, single episode 05/18/2015   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) 02/07/2014   Blood glucose elevated 02/07/2014   Obstructive apnea 02/07/2014   Derangement of medial meniscus, posterior horn 11/15/2013   Benign essential HTN 11/15/2013   History of artificial joint 11/15/2013    Past Surgical History:  Procedure Laterality Date   ABDOMINAL HYSTERECTOMY     BIOPSY  08/02/2023   Procedure: BIOPSY;  Surgeon: Cassie Click, MD;  Location: [facility] ENDOSCOPY;  Service: Gastroenterology;;   BREAST BIOPSY Left 06/06/2013   [facility]   BREAST LUMPECTOMY     COLONOSCOPY WITH PROPOFOL  N/A 01/17/2018   Procedure: COLONOSCOPY WITH PROPOFOL ;  Surgeon: Cassie Click, MD;  Location: [facility] ENDOSCOPY;  Service: Endoscopy;  Laterality: N/A;   COLONOSCOPY WITH PROPOFOL  N/A 08/02/2023   Procedure: COLONOSCOPY WITH PROPOFOL ;  Surgeon: Toledo, Alphonsus Jeans, MD;  Location: [facility] ENDOSCOPY;  Service: Gastroenterology;  Laterality: N/A;   ESOPHAGOGASTRODUODENOSCOPY (EGD) WITH PROPOFOL  N/A 01/17/2018   Procedure: ESOPHAGOGASTRODUODENOSCOPY (EGD) WITH PROPOFOL ;  Surgeon: Cassie Click, MD;  Location: [facility] ENDOSCOPY;  Service: Endoscopy;  Laterality: N/A;   HERNIA REPAIR     IR SCLEROTHERAPY OF A FLUID COLLECTION     JOINT REPLACEMENT Left 12/2008   VASCULAR SURGERY Right    VEIN STRIPPING    OB History   No obstetric history on file.      Home Medications    Prior to Admission medications   Medication Sig Start Date End Date Taking? Authorizing Provider  albuterol  (VENTOLIN  HFA) 108 (90 Base) MCG/ACT inhaler Inhale 2 puffs into the lungs every 6 (six) hours as  needed for wheezing or shortness of breath. 01/29/24  Yes Jeslie Lowe R, NP  benzonatate  (TESSALON ) 100 MG capsule Take 1 capsule (100 mg total) by mouth every 8 (eight) hours. 01/29/24  Yes Giah Fickett R, NP  cefdinir  (OMNICEF ) 300 MG capsule Take 1 capsule (300 mg total) by mouth 2 (two) times daily. 01/29/24  Yes Alfredia Desanctis, Maybelle Spatz, NP  promethazine -dextromethorphan (PROMETHAZINE -DM) 6.25-15 MG/5ML syrup Take 5 mLs by mouth at bedtime as needed. 01/29/24  Yes Joshva Labreck, Maybelle Spatz, NP  acetaminophen (TYLENOL) 500 MG tablet Take 500 mg by mouth every 6 (six) hours as needed.    [provider]  ARIPiprazole (ABILIFY) 10 MG tablet Take 10 mg by mouth daily.    [provider]  atorvastatin (LIPITOR) 40 MG tablet Take 40 mg by mouth daily at 6 PM.  11/18/14   [provider]  desvenlafaxine (PRISTIQ) 100 MG 24 hr tablet Take 100 mg by mouth daily.    [provider]  gabapentin (NEURONTIN) 300 MG capsule Take 300 mg by mouth at bedtime. Patient not taking: Reported on 08/02/2023    [provider]  hydrochlorothiazide (HYDRODIURIL) 25 MG tablet Take 25 mg by mouth daily.    [provider]  ketoconazole  (NIZORAL ) 2 % cream APPLY TO AFFECTED AREAS TOPICALLY TWICE DAILY AS DIRECTED 01/17/22   Hyatt, Max T, DPM  metoprolol (LOPRESSOR) 50 MG tablet Take 50 mg by mouth 2 (two) times daily.  11/18/14   [provider]  mometasone (ELOCON) 0.1 % cream Apply topically 2 (two) times daily. 10/26/23   [provider]  Multiple Vitamin (MULTI-VITAMINS) TABS Take 1 tablet by mouth daily.     [provider]  pantoprazole (PROTONIX) 40 MG tablet Take 40 mg by mouth 2 (two) times daily.  11/18/14   [provider]  telmisartan (MICARDIS) 40 MG tablet Take 40 mg by mouth daily. 07/19/21   [provider]  traZODone (DESYREL) 100 MG tablet Take 100 mg by mouth at bedtime.    [provider]  XARELTO  20 MG TABS tablet  Take 20 mg by mouth daily. 01/06/17   [provider]    Family History Family History  Problem Relation Age of Onset   Breast cancer Maternal Aunt        60's   Cancer Maternal Aunt    Mitral valve prolapse Mother    Cancer Father    AAA (abdominal aortic aneurysm) Father    Rheumatic fever Sister    Breast cancer Other        paternal half sister    Social History Social History   Tobacco Use   Smoking status: Never   Smokeless tobacco: Never  Vaping Use   Vaping status: Never Used  Substance Use Topics   Alcohol use: Yes    Comment: OCASSIONAL   Drug use: Never     Allergies   Amoxicillin-pot clavulanate, Actifed cold-allergy [chlorpheniramine-phenylephrine ],  Septra [sulfamethoxazole-trimethoprim], and Sulfa antibiotics   Review of Systems Review of Systems   Physical Exam Triage Vital Signs ED Triage Vitals  Encounter Vitals Group     BP 01/29/24 1053 105/73     Systolic BP Percentile --      Diastolic BP Percentile --      Pulse Rate 01/29/24 1053 76     Resp 01/29/24 1053 19     Temp 01/29/24 1053 97.9 F (36.6 C)     Temp Source 01/29/24 1053 Oral     SpO2 01/29/24 1053 95 %     Weight --      Height --      Head Circumference --      Peak Flow --      Pain Score 01/29/24 1052 0     Pain Loc --      Pain Education --      Exclude from Growth Chart --    No data found.  Updated Vital Signs BP 105/73 (BP Location: Right Arm)   Pulse 76   Temp 97.9 F (36.6 C) (Oral)   Resp 19   SpO2 95%   Visual Acuity Right Eye Distance:   Left Eye Distance:   Bilateral Distance:    Right Eye Near:   Left Eye Near:    Bilateral Near:     Physical Exam Constitutional:      Appearance: Normal appearance.  HENT:     Head: Normocephalic.     Right Ear: Tympanic membrane, ear canal and external ear normal.     Left Ear: Tympanic membrane, ear canal and external ear normal.     Nose: Congestion present.     Mouth/Throat:     Mouth:  Mucous membranes are moist.     Pharynx: Oropharynx is clear. No oropharyngeal exudate or posterior oropharyngeal erythema.  Eyes:     Extraocular Movements: Extraocular movements intact.  Cardiovascular:     Rate and Rhythm: Normal rate and regular rhythm.     Pulses: Normal pulses.     Heart sounds: Normal heart sounds.  Pulmonary:     Effort: Pulmonary effort is normal.     Breath sounds: Normal breath sounds.  Neurological:     Mental Status: She is alert and oriented to person, place, and time. Mental status is at baseline.      UC Treatments / Results  Labs (all labs ordered are listed, but only abnormal results are displayed) Labs Reviewed - No data to display  EKG   Radiology No results found.  Procedures Procedures (including critical care time)  Medications Ordered in UC Medications - No data to display  Initial Impression / Assessment and Plan / UC Course  I have reviewed the triage vital signs and the nursing notes.  Pertinent labs & imaging results that were available during my care of the patient were reviewed by me and considered in my medical decision making (see chart for details).  Acute URI  Patient is in no signs of distress nor toxic appearing.  Vital signs are stable.  Low suspicion for pneumonia, pneumothorax or bronchitis and therefore will defer imaging.  Viral testing deferred due to Tylenol and illness.  Symptoms present for 7 days will empirically provide bacterial coverage, prescribe cefdinir , intolerance to Augmentin and recent use of doxycycline  1 month ago.  Additionally prescribed Tessalon  and Promethazine  DM. May use additional over-the-counter medications as needed for supportive care.  May follow-up with urgent care as needed  if symptoms persist or worsen.    Final Clinical Impressions(s) / UC Diagnoses   Final diagnoses:  Acute URI   Discharge Instructions      Begin cefdinir  twice daily for 7 days  You may use Tessalon  pill  every 8 hours as needed for cough, may use cough syrup at bedtime if needing help to sleep  Rescue inhaler has been refilled    You can take Tylenol and/or Ibuprofen as needed for fever reduction and pain relief.   For cough: honey 1/2 to 1 teaspoon (you can dilute the honey in water or another fluid).  You can also use guaifenesin and dextromethorphan for cough. You can use a humidifier for chest congestion and cough.  If you don't have a humidifier, you can sit in the bathroom with the hot shower running.      For sore throat: try warm salt water gargles, cepacol lozenges, throat spray, warm tea or water with lemon/honey, popsicles or ice, or OTC cold relief medicine for throat discomfort.   For congestion: take a daily anti-histamine like Zyrtec, Claritin, and a oral decongestant, such as pseudoephedrine.  You can also use Flonase 1-2 sprays in each nostril daily.   It is important to stay hydrated: drink plenty of fluids (water, gatorade/powerade/pedialyte, juices, or teas) to keep your throat moisturized and help further relieve irritation/discomfort.   ED Prescriptions     Medication Sig Dispense Auth. Provider   cefdinir  (OMNICEF ) 300 MG capsule Take 1 capsule (300 mg total) by mouth 2 (two) times daily. 14 capsule Ty Buntrock R, NP   albuterol  (VENTOLIN  HFA) 108 (90 Base) MCG/ACT inhaler Inhale 2 puffs into the lungs every 6 (six) hours as needed for wheezing or shortness of breath. 8 g Corrigan Kretschmer R, NP   benzonatate  (TESSALON ) 100 MG capsule Take 1 capsule (100 mg total) by mouth every 8 (eight) hours. 21 capsule Ziquan Fidel R, NP   promethazine -dextromethorphan (PROMETHAZINE -DM) 6.25-15 MG/5ML syrup Take 5 mLs by mouth at bedtime as needed. 118 mL Severus Brodzinski, Maybelle Spatz, NP      PDMP not reviewed this encounter.   Reena Canning, NP 01/29/24 1133

## 2024-01-29 NOTE — Discharge Instructions (Addendum)
 Begin cefdinir  twice daily for 7 days  You may use Tessalon  pill every 8 hours as needed for cough, may use cough syrup at bedtime if needing help to sleep  Rescue inhaler has been refilled    You can take Tylenol and/or Ibuprofen as needed for fever reduction and pain relief.   For cough: honey 1/2 to 1 teaspoon (you can dilute the honey in water or another fluid).  You can also use guaifenesin and dextromethorphan for cough. You can use a humidifier for chest congestion and cough.  If you don't have a humidifier, you can sit in the bathroom with the hot shower running.      For sore throat: try warm salt water gargles, cepacol lozenges, throat spray, warm tea or water with lemon/honey, popsicles or ice, or OTC cold relief medicine for throat discomfort.   For congestion: take a daily anti-histamine like Zyrtec, Claritin, and a oral decongestant, such as pseudoephedrine.  You can also use Flonase 1-2 sprays in each nostril daily.   It is important to stay hydrated: drink plenty of fluids (water, gatorade/powerade/pedialyte, juices, or teas) to keep your throat moisturized and help further relieve irritation/discomfort.

## 2024-07-05 ENCOUNTER — Other Ambulatory Visit: Payer: Self-pay | Admitting: Internal Medicine

## 2024-07-05 DIAGNOSIS — Z1231 Encounter for screening mammogram for malignant neoplasm of breast: Secondary | ICD-10-CM

## 2024-09-19 ENCOUNTER — Ambulatory Visit
Admission: RE | Admit: 2024-09-19 | Discharge: 2024-09-19 | Disposition: A | Discharge status: Home / Self Care | Source: Ambulatory Visit | Attending: Internal Medicine | Admitting: Internal Medicine

## 2024-09-19 DIAGNOSIS — Z1231 Encounter for screening mammogram for malignant neoplasm of breast: Secondary | ICD-10-CM | POA: Diagnosis present

## 2024-09-27 ENCOUNTER — Other Ambulatory Visit: Payer: Self-pay | Admitting: Internal Medicine

## 2024-09-27 DIAGNOSIS — R928 Other abnormal and inconclusive findings on diagnostic imaging of breast: Secondary | ICD-10-CM

## 2024-10-02 ENCOUNTER — Ambulatory Visit
Admission: RE | Admit: 2024-10-02 | Discharge: 2024-10-02 | Disposition: A | Discharge status: Home / Self Care | Source: Ambulatory Visit | Attending: Internal Medicine | Admitting: Internal Medicine

## 2024-10-02 ENCOUNTER — Ambulatory Visit
Admission: RE | Admit: 2024-10-02 | Discharge: 2024-10-02 | Disposition: A | Source: Ambulatory Visit | Attending: Internal Medicine | Admitting: Internal Medicine

## 2024-10-02 DIAGNOSIS — R928 Other abnormal and inconclusive findings on diagnostic imaging of breast: Secondary | ICD-10-CM | POA: Insufficient documentation
